# Patient Record
Sex: Male | Born: 1945 | Race: White | Hispanic: No | Marital: Married | Smoking: Former smoker
Health system: Southern US, Community
[De-identification: ages and names within clinical notes are randomized; demographics above are authoritative.]

## PROBLEM LIST (undated history)

## (undated) DIAGNOSIS — R739 Hyperglycemia, unspecified: Secondary | ICD-10-CM

## (undated) DIAGNOSIS — A809 Acute poliomyelitis, unspecified: Secondary | ICD-10-CM

## (undated) DIAGNOSIS — I2510 Atherosclerotic heart disease of native coronary artery without angina pectoris: Secondary | ICD-10-CM

## (undated) DIAGNOSIS — E785 Hyperlipidemia, unspecified: Secondary | ICD-10-CM

## (undated) DIAGNOSIS — I10 Essential (primary) hypertension: Secondary | ICD-10-CM

## (undated) DIAGNOSIS — I351 Nonrheumatic aortic (valve) insufficiency: Secondary | ICD-10-CM

## (undated) HISTORY — DX: Essential (primary) hypertension: I10

## (undated) HISTORY — PX: FISTULA PLUG: SHX5831

## (undated) HISTORY — DX: Hyperlipidemia, unspecified: E78.5

## (undated) HISTORY — DX: Acute poliomyelitis, unspecified: A80.9

## (undated) HISTORY — DX: Nonrheumatic aortic (valve) insufficiency: I35.1

## (undated) HISTORY — DX: Atherosclerotic heart disease of native coronary artery without angina pectoris: I25.10

## (undated) HISTORY — DX: Hyperglycemia, unspecified: R73.9

---

## 2014-02-12 DIAGNOSIS — Z0000 Encounter for general adult medical examination without abnormal findings: Secondary | ICD-10-CM | POA: Diagnosis not present

## 2014-02-12 DIAGNOSIS — E782 Mixed hyperlipidemia: Secondary | ICD-10-CM | POA: Diagnosis not present

## 2014-02-12 DIAGNOSIS — E161 Other hypoglycemia: Secondary | ICD-10-CM | POA: Diagnosis not present

## 2014-02-12 DIAGNOSIS — Z23 Encounter for immunization: Secondary | ICD-10-CM | POA: Diagnosis not present

## 2014-02-12 DIAGNOSIS — Z125 Encounter for screening for malignant neoplasm of prostate: Secondary | ICD-10-CM | POA: Diagnosis not present

## 2014-02-19 DIAGNOSIS — K760 Fatty (change of) liver, not elsewhere classified: Secondary | ICD-10-CM | POA: Diagnosis not present

## 2014-02-19 DIAGNOSIS — Z23 Encounter for immunization: Secondary | ICD-10-CM | POA: Diagnosis not present

## 2014-02-19 DIAGNOSIS — R0989 Other specified symptoms and signs involving the circulatory and respiratory systems: Secondary | ICD-10-CM | POA: Diagnosis not present

## 2014-02-19 DIAGNOSIS — E782 Mixed hyperlipidemia: Secondary | ICD-10-CM | POA: Diagnosis not present

## 2014-02-19 DIAGNOSIS — I779 Disorder of arteries and arterioles, unspecified: Secondary | ICD-10-CM | POA: Diagnosis not present

## 2014-06-10 DIAGNOSIS — K641 Second degree hemorrhoids: Secondary | ICD-10-CM | POA: Diagnosis not present

## 2014-06-10 DIAGNOSIS — Z1211 Encounter for screening for malignant neoplasm of colon: Secondary | ICD-10-CM | POA: Diagnosis not present

## 2014-06-10 DIAGNOSIS — D128 Benign neoplasm of rectum: Secondary | ICD-10-CM | POA: Diagnosis not present

## 2014-06-10 DIAGNOSIS — K621 Rectal polyp: Secondary | ICD-10-CM | POA: Diagnosis not present

## 2014-06-27 DIAGNOSIS — H2513 Age-related nuclear cataract, bilateral: Secondary | ICD-10-CM | POA: Diagnosis not present

## 2014-06-27 DIAGNOSIS — H11153 Pinguecula, bilateral: Secondary | ICD-10-CM | POA: Diagnosis not present

## 2014-06-27 DIAGNOSIS — H524 Presbyopia: Secondary | ICD-10-CM | POA: Diagnosis not present

## 2015-02-11 DIAGNOSIS — Z6824 Body mass index (BMI) 24.0-24.9, adult: Secondary | ICD-10-CM | POA: Diagnosis not present

## 2015-02-11 DIAGNOSIS — N529 Male erectile dysfunction, unspecified: Secondary | ICD-10-CM | POA: Diagnosis not present

## 2015-02-11 DIAGNOSIS — Z23 Encounter for immunization: Secondary | ICD-10-CM | POA: Diagnosis not present

## 2015-02-11 DIAGNOSIS — M5432 Sciatica, left side: Secondary | ICD-10-CM | POA: Diagnosis not present

## 2015-02-11 DIAGNOSIS — R143 Flatulence: Secondary | ICD-10-CM | POA: Diagnosis not present

## 2015-02-11 DIAGNOSIS — F17200 Nicotine dependence, unspecified, uncomplicated: Secondary | ICD-10-CM | POA: Diagnosis not present

## 2015-02-11 DIAGNOSIS — H6122 Impacted cerumen, left ear: Secondary | ICD-10-CM | POA: Diagnosis not present

## 2015-02-11 DIAGNOSIS — Z0000 Encounter for general adult medical examination without abnormal findings: Secondary | ICD-10-CM | POA: Diagnosis not present

## 2015-02-11 DIAGNOSIS — R413 Other amnesia: Secondary | ICD-10-CM | POA: Diagnosis not present

## 2015-02-11 DIAGNOSIS — Z125 Encounter for screening for malignant neoplasm of prostate: Secondary | ICD-10-CM | POA: Diagnosis not present

## 2015-02-11 DIAGNOSIS — E785 Hyperlipidemia, unspecified: Secondary | ICD-10-CM | POA: Diagnosis not present

## 2015-02-11 DIAGNOSIS — Z1389 Encounter for screening for other disorder: Secondary | ICD-10-CM | POA: Diagnosis not present

## 2015-04-09 ENCOUNTER — Other Ambulatory Visit: Payer: Self-pay | Admitting: Acute Care

## 2015-04-09 ENCOUNTER — Encounter: Payer: Self-pay | Admitting: Acute Care

## 2015-04-09 DIAGNOSIS — F17210 Nicotine dependence, cigarettes, uncomplicated: Principal | ICD-10-CM

## 2015-04-13 ENCOUNTER — Encounter: Payer: Self-pay | Admitting: Acute Care

## 2015-04-13 ENCOUNTER — Other Ambulatory Visit: Payer: Self-pay | Admitting: Acute Care

## 2015-04-13 DIAGNOSIS — F17210 Nicotine dependence, cigarettes, uncomplicated: Principal | ICD-10-CM

## 2015-07-03 DIAGNOSIS — H524 Presbyopia: Secondary | ICD-10-CM | POA: Diagnosis not present

## 2015-07-03 DIAGNOSIS — H2513 Age-related nuclear cataract, bilateral: Secondary | ICD-10-CM | POA: Diagnosis not present

## 2015-07-03 DIAGNOSIS — H11153 Pinguecula, bilateral: Secondary | ICD-10-CM | POA: Diagnosis not present

## 2015-08-12 DIAGNOSIS — Z6825 Body mass index (BMI) 25.0-25.9, adult: Secondary | ICD-10-CM | POA: Diagnosis not present

## 2015-08-12 DIAGNOSIS — L989 Disorder of the skin and subcutaneous tissue, unspecified: Secondary | ICD-10-CM | POA: Diagnosis not present

## 2015-08-12 DIAGNOSIS — E738 Other lactose intolerance: Secondary | ICD-10-CM | POA: Diagnosis not present

## 2015-08-12 DIAGNOSIS — N529 Male erectile dysfunction, unspecified: Secondary | ICD-10-CM | POA: Diagnosis not present

## 2015-08-12 DIAGNOSIS — F17200 Nicotine dependence, unspecified, uncomplicated: Secondary | ICD-10-CM | POA: Diagnosis not present

## 2015-08-12 DIAGNOSIS — E784 Other hyperlipidemia: Secondary | ICD-10-CM | POA: Diagnosis not present

## 2015-09-15 DIAGNOSIS — D692 Other nonthrombocytopenic purpura: Secondary | ICD-10-CM | POA: Diagnosis not present

## 2015-09-15 DIAGNOSIS — Z85828 Personal history of other malignant neoplasm of skin: Secondary | ICD-10-CM | POA: Diagnosis not present

## 2015-09-15 DIAGNOSIS — C44719 Basal cell carcinoma of skin of left lower limb, including hip: Secondary | ICD-10-CM | POA: Diagnosis not present

## 2015-09-15 DIAGNOSIS — L821 Other seborrheic keratosis: Secondary | ICD-10-CM | POA: Diagnosis not present

## 2015-10-06 ENCOUNTER — Other Ambulatory Visit: Payer: Self-pay | Admitting: Internal Medicine

## 2015-10-06 DIAGNOSIS — F17200 Nicotine dependence, unspecified, uncomplicated: Secondary | ICD-10-CM

## 2015-10-16 ENCOUNTER — Ambulatory Visit
Admission: RE | Admit: 2015-10-16 | Discharge: 2015-10-16 | Disposition: A | Discharge status: Home / Self Care | Payer: Medicare Other | Source: Ambulatory Visit | Attending: Internal Medicine | Admitting: Internal Medicine

## 2015-10-16 DIAGNOSIS — Z87891 Personal history of nicotine dependence: Secondary | ICD-10-CM | POA: Diagnosis not present

## 2015-10-16 DIAGNOSIS — F17200 Nicotine dependence, unspecified, uncomplicated: Secondary | ICD-10-CM

## 2015-10-22 ENCOUNTER — Other Ambulatory Visit: Payer: Self-pay | Admitting: Acute Care

## 2015-10-22 DIAGNOSIS — Z87891 Personal history of nicotine dependence: Secondary | ICD-10-CM

## 2016-01-09 DIAGNOSIS — Z23 Encounter for immunization: Secondary | ICD-10-CM | POA: Diagnosis not present

## 2016-02-19 DIAGNOSIS — E738 Other lactose intolerance: Secondary | ICD-10-CM | POA: Diagnosis not present

## 2016-02-19 DIAGNOSIS — E784 Other hyperlipidemia: Secondary | ICD-10-CM | POA: Diagnosis not present

## 2016-02-19 DIAGNOSIS — Z125 Encounter for screening for malignant neoplasm of prostate: Secondary | ICD-10-CM | POA: Diagnosis not present

## 2016-02-26 DIAGNOSIS — M5432 Sciatica, left side: Secondary | ICD-10-CM | POA: Diagnosis not present

## 2016-02-26 DIAGNOSIS — I700 Atherosclerosis of aorta: Secondary | ICD-10-CM | POA: Diagnosis not present

## 2016-02-26 DIAGNOSIS — K8020 Calculus of gallbladder without cholecystitis without obstruction: Secondary | ICD-10-CM | POA: Diagnosis not present

## 2016-02-26 DIAGNOSIS — R413 Other amnesia: Secondary | ICD-10-CM | POA: Diagnosis not present

## 2016-02-26 DIAGNOSIS — E739 Lactose intolerance, unspecified: Secondary | ICD-10-CM | POA: Diagnosis not present

## 2016-02-26 DIAGNOSIS — Z1389 Encounter for screening for other disorder: Secondary | ICD-10-CM | POA: Diagnosis not present

## 2016-02-26 DIAGNOSIS — R918 Other nonspecific abnormal finding of lung field: Secondary | ICD-10-CM | POA: Diagnosis not present

## 2016-02-26 DIAGNOSIS — L989 Disorder of the skin and subcutaneous tissue, unspecified: Secondary | ICD-10-CM | POA: Diagnosis not present

## 2016-02-26 DIAGNOSIS — Z0000 Encounter for general adult medical examination without abnormal findings: Secondary | ICD-10-CM | POA: Diagnosis not present

## 2016-02-26 DIAGNOSIS — H6122 Impacted cerumen, left ear: Secondary | ICD-10-CM | POA: Diagnosis not present

## 2016-02-26 DIAGNOSIS — Z6822 Body mass index (BMI) 22.0-22.9, adult: Secondary | ICD-10-CM | POA: Diagnosis not present

## 2016-02-26 DIAGNOSIS — E784 Other hyperlipidemia: Secondary | ICD-10-CM | POA: Diagnosis not present

## 2016-02-29 DIAGNOSIS — Z1212 Encounter for screening for malignant neoplasm of rectum: Secondary | ICD-10-CM | POA: Diagnosis not present

## 2016-10-17 ENCOUNTER — Ambulatory Visit
Admission: RE | Admit: 2016-10-17 | Discharge: 2016-10-17 | Disposition: A | Discharge status: Home / Self Care | Payer: Medicare Other | Source: Ambulatory Visit | Attending: Acute Care | Admitting: Acute Care

## 2016-10-17 DIAGNOSIS — Z87891 Personal history of nicotine dependence: Secondary | ICD-10-CM

## 2016-10-19 ENCOUNTER — Other Ambulatory Visit: Payer: Self-pay | Admitting: Acute Care

## 2016-10-19 DIAGNOSIS — Z87891 Personal history of nicotine dependence: Secondary | ICD-10-CM

## 2017-02-04 DIAGNOSIS — Z23 Encounter for immunization: Secondary | ICD-10-CM | POA: Diagnosis not present

## 2017-05-22 DIAGNOSIS — E7849 Other hyperlipidemia: Secondary | ICD-10-CM | POA: Diagnosis not present

## 2017-05-22 DIAGNOSIS — Z125 Encounter for screening for malignant neoplasm of prostate: Secondary | ICD-10-CM | POA: Diagnosis not present

## 2017-05-22 DIAGNOSIS — R82998 Other abnormal findings in urine: Secondary | ICD-10-CM | POA: Diagnosis not present

## 2017-05-29 DIAGNOSIS — R918 Other nonspecific abnormal finding of lung field: Secondary | ICD-10-CM | POA: Diagnosis not present

## 2017-05-29 DIAGNOSIS — K8020 Calculus of gallbladder without cholecystitis without obstruction: Secondary | ICD-10-CM | POA: Diagnosis not present

## 2017-05-29 DIAGNOSIS — Z0000 Encounter for general adult medical examination without abnormal findings: Secondary | ICD-10-CM | POA: Diagnosis not present

## 2017-05-29 DIAGNOSIS — E739 Lactose intolerance, unspecified: Secondary | ICD-10-CM | POA: Diagnosis not present

## 2017-05-29 DIAGNOSIS — M5432 Sciatica, left side: Secondary | ICD-10-CM | POA: Diagnosis not present

## 2017-05-29 DIAGNOSIS — R413 Other amnesia: Secondary | ICD-10-CM | POA: Diagnosis not present

## 2017-05-29 DIAGNOSIS — Z6824 Body mass index (BMI) 24.0-24.9, adult: Secondary | ICD-10-CM | POA: Diagnosis not present

## 2017-05-29 DIAGNOSIS — L988 Other specified disorders of the skin and subcutaneous tissue: Secondary | ICD-10-CM | POA: Diagnosis not present

## 2017-05-29 DIAGNOSIS — Z1212 Encounter for screening for malignant neoplasm of rectum: Secondary | ICD-10-CM | POA: Diagnosis not present

## 2017-05-29 DIAGNOSIS — Z1389 Encounter for screening for other disorder: Secondary | ICD-10-CM | POA: Diagnosis not present

## 2017-05-29 DIAGNOSIS — I700 Atherosclerosis of aorta: Secondary | ICD-10-CM | POA: Diagnosis not present

## 2017-05-29 DIAGNOSIS — R143 Flatulence: Secondary | ICD-10-CM | POA: Diagnosis not present

## 2017-05-29 DIAGNOSIS — R011 Cardiac murmur, unspecified: Secondary | ICD-10-CM | POA: Diagnosis not present

## 2017-06-01 DIAGNOSIS — Z13820 Encounter for screening for osteoporosis: Secondary | ICD-10-CM | POA: Diagnosis not present

## 2017-11-01 ENCOUNTER — Other Ambulatory Visit: Payer: Self-pay | Admitting: Internal Medicine

## 2017-11-01 DIAGNOSIS — R911 Solitary pulmonary nodule: Secondary | ICD-10-CM

## 2017-11-06 ENCOUNTER — Ambulatory Visit
Admission: RE | Admit: 2017-11-06 | Discharge: 2017-11-06 | Disposition: A | Discharge status: Home / Self Care | Payer: Medicare Other | Source: Ambulatory Visit | Attending: Acute Care | Admitting: Acute Care

## 2017-11-06 DIAGNOSIS — Z87891 Personal history of nicotine dependence: Secondary | ICD-10-CM | POA: Diagnosis not present

## 2017-11-14 ENCOUNTER — Other Ambulatory Visit: Payer: Self-pay | Admitting: Acute Care

## 2017-11-14 DIAGNOSIS — Z87891 Personal history of nicotine dependence: Secondary | ICD-10-CM

## 2017-11-14 DIAGNOSIS — Z122 Encounter for screening for malignant neoplasm of respiratory organs: Secondary | ICD-10-CM

## 2018-01-05 DIAGNOSIS — R011 Cardiac murmur, unspecified: Secondary | ICD-10-CM | POA: Diagnosis not present

## 2018-01-06 DIAGNOSIS — Z23 Encounter for immunization: Secondary | ICD-10-CM | POA: Diagnosis not present

## 2018-05-28 DIAGNOSIS — M859 Disorder of bone density and structure, unspecified: Secondary | ICD-10-CM | POA: Diagnosis not present

## 2018-05-28 DIAGNOSIS — E7849 Other hyperlipidemia: Secondary | ICD-10-CM | POA: Diagnosis not present

## 2018-05-28 DIAGNOSIS — R7301 Impaired fasting glucose: Secondary | ICD-10-CM | POA: Diagnosis not present

## 2018-05-28 DIAGNOSIS — R82998 Other abnormal findings in urine: Secondary | ICD-10-CM | POA: Diagnosis not present

## 2018-05-28 DIAGNOSIS — Z125 Encounter for screening for malignant neoplasm of prostate: Secondary | ICD-10-CM | POA: Diagnosis not present

## 2018-06-01 DIAGNOSIS — Z6826 Body mass index (BMI) 26.0-26.9, adult: Secondary | ICD-10-CM | POA: Diagnosis not present

## 2018-06-01 DIAGNOSIS — R011 Cardiac murmur, unspecified: Secondary | ICD-10-CM | POA: Diagnosis not present

## 2018-06-01 DIAGNOSIS — I700 Atherosclerosis of aorta: Secondary | ICD-10-CM | POA: Diagnosis not present

## 2018-06-01 DIAGNOSIS — Z1339 Encounter for screening examination for other mental health and behavioral disorders: Secondary | ICD-10-CM | POA: Diagnosis not present

## 2018-06-01 DIAGNOSIS — Z0000 Encounter for general adult medical examination without abnormal findings: Secondary | ICD-10-CM | POA: Diagnosis not present

## 2018-06-01 DIAGNOSIS — R7301 Impaired fasting glucose: Secondary | ICD-10-CM | POA: Diagnosis not present

## 2018-06-01 DIAGNOSIS — I2510 Atherosclerotic heart disease of native coronary artery without angina pectoris: Secondary | ICD-10-CM | POA: Diagnosis not present

## 2018-06-01 DIAGNOSIS — I351 Nonrheumatic aortic (valve) insufficiency: Secondary | ICD-10-CM | POA: Diagnosis not present

## 2018-06-01 DIAGNOSIS — M859 Disorder of bone density and structure, unspecified: Secondary | ICD-10-CM | POA: Diagnosis not present

## 2018-06-01 DIAGNOSIS — R413 Other amnesia: Secondary | ICD-10-CM | POA: Diagnosis not present

## 2018-06-01 DIAGNOSIS — Z1331 Encounter for screening for depression: Secondary | ICD-10-CM | POA: Diagnosis not present

## 2018-06-01 DIAGNOSIS — R918 Other nonspecific abnormal finding of lung field: Secondary | ICD-10-CM | POA: Diagnosis not present

## 2018-06-04 ENCOUNTER — Other Ambulatory Visit: Payer: Self-pay | Admitting: Internal Medicine

## 2018-06-04 DIAGNOSIS — R911 Solitary pulmonary nodule: Secondary | ICD-10-CM

## 2018-06-06 DIAGNOSIS — Z1212 Encounter for screening for malignant neoplasm of rectum: Secondary | ICD-10-CM | POA: Diagnosis not present

## 2018-06-08 ENCOUNTER — Other Ambulatory Visit: Payer: Self-pay | Admitting: Internal Medicine

## 2018-06-08 DIAGNOSIS — I351 Nonrheumatic aortic (valve) insufficiency: Secondary | ICD-10-CM

## 2018-06-29 ENCOUNTER — Encounter: Payer: Self-pay | Admitting: Cardiology

## 2018-06-29 ENCOUNTER — Ambulatory Visit (INDEPENDENT_AMBULATORY_CARE_PROVIDER_SITE_OTHER): Payer: Medicare Other | Admitting: Cardiology

## 2018-06-29 VITALS — BP 157/78 | HR 72 | Ht 67.0 in | Wt 173.9 lb

## 2018-06-29 DIAGNOSIS — K219 Gastro-esophageal reflux disease without esophagitis: Secondary | ICD-10-CM | POA: Diagnosis not present

## 2018-06-29 DIAGNOSIS — I10 Essential (primary) hypertension: Secondary | ICD-10-CM

## 2018-06-29 DIAGNOSIS — E785 Hyperlipidemia, unspecified: Secondary | ICD-10-CM | POA: Insufficient documentation

## 2018-06-29 DIAGNOSIS — I351 Nonrheumatic aortic (valve) insufficiency: Secondary | ICD-10-CM | POA: Diagnosis not present

## 2018-06-29 DIAGNOSIS — Z0189 Encounter for other specified special examinations: Secondary | ICD-10-CM | POA: Diagnosis not present

## 2018-06-29 DIAGNOSIS — I2510 Atherosclerotic heart disease of native coronary artery without angina pectoris: Secondary | ICD-10-CM

## 2018-06-29 DIAGNOSIS — R739 Hyperglycemia, unspecified: Secondary | ICD-10-CM

## 2018-06-29 DIAGNOSIS — Z136 Encounter for screening for cardiovascular disorders: Secondary | ICD-10-CM | POA: Diagnosis not present

## 2018-06-29 DIAGNOSIS — R002 Palpitations: Secondary | ICD-10-CM

## 2018-06-29 HISTORY — DX: Hyperlipidemia, unspecified: E78.5

## 2018-06-29 HISTORY — DX: Atherosclerotic heart disease of native coronary artery without angina pectoris: I25.10

## 2018-06-29 HISTORY — DX: Nonrheumatic aortic (valve) insufficiency: I35.1

## 2018-06-29 HISTORY — DX: Hyperglycemia, unspecified: R73.9

## 2018-06-29 MED ORDER — LOSARTAN POTASSIUM 50 MG PO TABS: 50.0000 mg | ORAL_TABLET | Freq: Every day | ORAL | 1 refills | Status: DC

## 2018-06-29 NOTE — Progress Notes (Signed)
Subjective:  Primary Physician:  Crist Infante, MD  Patient ID: Gary Martin, male    DOB: 05/11/1945, 73 y.o.   MRN: [id number]  Chief Complaint  Patient presents with  . New Patient (Initial Visit)    Aortic Regurgitation    HPI: Gary Martin  is a 73 y.o. male  with Patient with no significant prior cardiovascular history except for coronary atherosclerosis and aortic atherosclerosis noted by CT scan of the chest performed in 2018, mild hyperlipidemia, hyperglycemia, moderate aortic regurgitation noted in 2019 referred to me to establish cardiac care as over the past one year he has noticed  Palpitations like " flip-flop" and heart feels like it stops and also has noticed tingling in both arms. Episodes are sporadic. He wanted to have this checked out.  Only other symptom is  Daily sour eructation.    Past Medical History:  Diagnosis Date  . Coronary artery calcification seen on CAT scan 06/29/2018   06/18 CT: Coronary and aortic atheresclerosis  . Hyperglycemia 06/29/2018  . Mild hyperlipidemia 06/29/2018  . Moderate aortic regurgitation 06/29/2018  . Polio     Past Surgical History:  Procedure Laterality Date  . FISTULA PLUG     from tail bone    Social History   Socioeconomic History  . Marital status: Married    Spouse name: Not on file  . Number of children: 2  . Years of education: Not on file  . Highest education level: Not on file  Occupational History  . Not on file  Social Needs  . Financial resource strain: Not on file  . Food insecurity:    Worry: Not on file    Inability: Not on file  . Transportation needs:    Medical: Not on file    Non-medical: Not on file  Tobacco Use  . Smoking status: Former Smoker    Packs/day: 2.00    Years: 30.00    Pack years: 60.00    Types: Cigarettes    Start date: 03/26/1963    Last attempt to quit: 03/25/2014    Years since quitting: 4.2  . Smokeless tobacco: Never Used  Substance and Sexual Activity  . Alcohol use:  Yes    Alcohol/week: 1.0 standard drinks    Types: 1 Cans of beer per week    Comment: occ  . Drug use: Never  . Sexual activity: Not on file  Lifestyle  . Physical activity:    Days per week: Not on file    Minutes per session: Not on file  . Stress: Not on file  Relationships  . Social connections:    Talks on phone: Not on file    Gets together: Not on file    Attends religious service: Not on file    Active member of club or organization: Not on file    Attends meetings of clubs or organizations: Not on file    Relationship status: Not on file  . Intimate partner violence:    Fear of current or ex partner: Not on file    Emotionally abused: Not on file    Physically abused: Not on file    Forced sexual activity: Not on file  Other Topics Concern  . Not on file  Social History Narrative  . Not on file    Current Outpatient Medications on File Prior to Visit  Medication Sig Dispense Refill  . aspirin EC 81 MG tablet Take 81 mg by mouth daily.    Marland Kitchen  pravastatin (PRAVACHOL) 40 MG tablet Take 1 tablet by mouth daily.     No current facility-administered medications on file prior to visit.    Review of Systems  Constitutional: Negative for malaise/fatigue and weight loss.  Respiratory: Negative for cough, hemoptysis and shortness of breath.   Cardiovascular: Positive for palpitations. Negative for chest pain, claudication and leg swelling.  Gastrointestinal: Positive for heartburn (sour erutation). Negative for abdominal pain, blood in stool, constipation and vomiting.  Genitourinary: Negative for dysuria.  Musculoskeletal: Negative for joint pain and myalgias.  Neurological: Negative for dizziness, focal weakness and headaches.  Endo/Heme/Allergies: Does not bruise/bleed easily.  Psychiatric/Behavioral: Negative for depression. The patient is not nervous/anxious.   All other systems reviewed and are negative.     Objective:  Blood pressure (!) 157/78, pulse 72, height  '5\' 7"'$  (1.702 m), weight 173 lb 14.4 oz (78.9 kg), SpO2 97 %. Body mass index is 27.24 kg/m.  Physical Exam  Constitutional: He appears well-developed and well-nourished. No distress.  HENT:  Head: Atraumatic.  Eyes: Conjunctivae are normal.  Neck: Neck supple. No JVD present. No thyromegaly present.  Cardiovascular: Normal rate, regular rhythm and intact distal pulses. Exam reveals no gallop.  Murmur heard.  Blowing early systolic murmur is present with a grade of 2/6 at the upper right sternal border radiating to the neck.  Diastolic murmur is present with a grade of 2/6 at the upper right sternal border and lower left sternal border. Pulmonary/Chest: Effort normal and breath sounds normal.  Abdominal: Soft. Bowel sounds are normal.  Musculoskeletal: Normal range of motion.        General: No edema.  Neurological: He is alert.  Skin: Skin is warm and dry.  Psychiatric: He has a normal mood and affect.    CARDIAC STUDIES:   Echocardiogram 01/05/2018: Left ventricle cavity is normal in size. Normal global wall motion. Normal diastolic filling pattern. Calculated EF 65%. Moderate (Grade II) aortic regurgitation. Mild tricuspid regurgitation. No evidence of pulmonary hypertension  Assessment & Recommendations:   1. Moderate aortic regurgitation  2. Coronary artery calcification seen on CAT scan  3. Mild hyperlipidemia  4. Hyperglycemia  5. Palpitations  EKG 06/29/2018: Normal sinus rhythm at rate of 70 bpm, normal axis.  Incomplete right bundle branch block.  No evidence of ischemia, otherwise normal EKG.  6.  Sour eructation and GERD.  7. H/O tobacco use quit 2016  50-60 pack year history of smoking cigarettes  8. Screening for AAA  9. Elevated BP  Laboratory examination: Labs 05/28/2018: Urine analysis normal.  Serum glucose 102 mg, BUN 18, creatinine 1.0, eGFR greater than 61, potassium 5.4.  HB 15.0/HCT 45.7, platelets 172.  Total cholesterol 134, triglycerides  124, HDL 41, LDL 68, non-HDL cholesterol 93.  TSH normal.  Apo lipoprotein be 68.0, normal.  White him and he 41.8.  Recommendation:  Patient referred to me for evaluation of palpitations and aortic regurgitation noted on echocardiogram.  He has significant history of tobacco use, 50-60-pack-year history, he also has coronary atherosclerosis and aortic atherosclerosis.  His lipids are very well controlled, he has very mild hyperglycemia.  He appears to be asymptomatic, walking for about half a mile on the treadmill.  However due to palpitations, also abnormal CT scan, would like him to do a routine treadmill exercise stress test to exclude ischemia-induced arrhythmias.  His blood pressure was elevated today but he has now been told to have hypertension, blood pressures all been less than 130 mmHg at home.  Hence I did not make any changes to his medication.  I would like him to be on an ACE inhibitor in view of aortic and coronary atherosclerosis and also aortic regurgitation.  Patient was initially reluctant, but he is willing to take the medication.  I'll start him on losartan 50 mg daily, advised him to have a BMP in 2 weeks.  I'll see him back after the treadmill stress test and make final recommendations.  With regard to aortic regurgitation, continued observation is indicated. I will also set him up for abdominal aortic aneurysm screening. He can try PPI and advised patient to take 30 minutes prior to a meal once a day, but can start twice daily for a week.   Adrian Prows, MD, [facility] 06/29/2018, 1:32 PM [facility] [place] Pager: [phone] Office: [phone] If no answer Cell [phone]

## 2018-07-02 DIAGNOSIS — I10 Essential (primary) hypertension: Secondary | ICD-10-CM | POA: Diagnosis not present

## 2018-07-08 DIAGNOSIS — I10 Essential (primary) hypertension: Secondary | ICD-10-CM | POA: Diagnosis not present

## 2018-07-16 ENCOUNTER — Other Ambulatory Visit: Payer: Medicare Other

## 2018-09-03 ENCOUNTER — Ambulatory Visit: Payer: Medicare Other | Admitting: Cardiology

## 2018-12-21 DIAGNOSIS — Z23 Encounter for immunization: Secondary | ICD-10-CM | POA: Diagnosis not present

## 2019-01-01 ENCOUNTER — Other Ambulatory Visit: Payer: Self-pay

## 2019-02-05 ENCOUNTER — Ambulatory Visit: Payer: Medicare Other | Admitting: Physician Assistant

## 2019-03-15 DIAGNOSIS — C44619 Basal cell carcinoma of skin of left upper limb, including shoulder: Secondary | ICD-10-CM | POA: Diagnosis not present

## 2019-03-15 DIAGNOSIS — C4441 Basal cell carcinoma of skin of scalp and neck: Secondary | ICD-10-CM | POA: Diagnosis not present

## 2019-03-15 DIAGNOSIS — L570 Actinic keratosis: Secondary | ICD-10-CM | POA: Diagnosis not present

## 2019-03-15 DIAGNOSIS — Z85828 Personal history of other malignant neoplasm of skin: Secondary | ICD-10-CM | POA: Diagnosis not present

## 2019-03-18 ENCOUNTER — Ambulatory Visit: Payer: Medicare Other | Admitting: Family Medicine

## 2019-06-03 DIAGNOSIS — J869 Pyothorax without fistula: Secondary | ICD-10-CM | POA: Diagnosis not present

## 2019-06-03 DIAGNOSIS — R040 Epistaxis: Secondary | ICD-10-CM | POA: Diagnosis not present

## 2019-06-03 DIAGNOSIS — E785 Hyperlipidemia, unspecified: Secondary | ICD-10-CM | POA: Diagnosis not present

## 2019-06-03 DIAGNOSIS — I10 Essential (primary) hypertension: Secondary | ICD-10-CM | POA: Diagnosis not present

## 2019-06-03 DIAGNOSIS — K219 Gastro-esophageal reflux disease without esophagitis: Secondary | ICD-10-CM | POA: Diagnosis not present

## 2019-06-23 ENCOUNTER — Ambulatory Visit: Payer: Medicare Other | Attending: Internal Medicine

## 2019-06-23 DIAGNOSIS — Z23 Encounter for immunization: Secondary | ICD-10-CM | POA: Insufficient documentation

## 2019-06-23 NOTE — Progress Notes (Signed)
   Covid-19 Vaccination Clinic  Name:  Xzaiden Claborn    MRN: [id number] DOB: [birth year]  06/23/2019  Mr. Linna Darner was observed post Covid-19 immunization for 30 minutes based on pre-vaccination screening without incidence. He was provided with Vaccine Information Sheet and instruction to access the V-Safe system.   Mr. Linna Darner was instructed to call 911 with any severe reactions post vaccine: Marland Kitchen Difficulty breathing  . Swelling of your face and throat  . A fast heartbeat  . A bad rash all over your body  . Dizziness and weakness    Immunizations Administered    Name Date Dose VIS Date Route   Pfizer COVID-19 Vaccine 06/23/2019  2:37 PM 0.3 mL 04/05/2019 Intramuscular   Manufacturer: [employer]   Lot: [id number]   [place]: [id number]

## 2019-07-16 ENCOUNTER — Ambulatory Visit: Payer: Medicare Other | Admitting: Family Medicine

## 2019-07-23 ENCOUNTER — Ambulatory Visit: Payer: Medicare Other | Attending: Internal Medicine

## 2019-07-23 DIAGNOSIS — Z23 Encounter for immunization: Secondary | ICD-10-CM

## 2019-07-23 NOTE — Progress Notes (Signed)
   Covid-19 Vaccination Clinic  Name:  Gary Martin    MRN: [id number] DOB: Mar 24, 1946  07/23/2019  [name] was observed post Covid-19 immunization for 30 minutes based on pre-vaccination screening without incident. He was provided with Vaccine Information Sheet and instruction to access the V-Safe system.   [name] was instructed to call 911 with any severe reactions post vaccine: Marland Kitchen Difficulty breathing  . Swelling of face and throat  . A fast heartbeat  . A bad rash all over body  . Dizziness and weakness   Immunizations Administered    Name Date Dose VIS Date Route   Pfizer COVID-19 Vaccine 07/23/2019  2:40 PM 0.3 mL 04/05/2019 Intramuscular   Manufacturer: [employer]   Lot: [id number]   [place]: [id number]

## 2019-08-06 DIAGNOSIS — I10 Essential (primary) hypertension: Secondary | ICD-10-CM | POA: Diagnosis not present

## 2019-08-06 DIAGNOSIS — Z87891 Personal history of nicotine dependence: Secondary | ICD-10-CM | POA: Diagnosis not present

## 2019-08-06 DIAGNOSIS — R7309 Other abnormal glucose: Secondary | ICD-10-CM | POA: Diagnosis not present

## 2019-08-06 DIAGNOSIS — E785 Hyperlipidemia, unspecified: Secondary | ICD-10-CM | POA: Diagnosis not present

## 2019-08-06 DIAGNOSIS — Z0000 Encounter for general adult medical examination without abnormal findings: Secondary | ICD-10-CM | POA: Diagnosis not present

## 2019-08-06 DIAGNOSIS — J869 Pyothorax without fistula: Secondary | ICD-10-CM | POA: Diagnosis not present

## 2019-08-06 DIAGNOSIS — Z1389 Encounter for screening for other disorder: Secondary | ICD-10-CM | POA: Diagnosis not present

## 2019-08-06 DIAGNOSIS — K219 Gastro-esophageal reflux disease without esophagitis: Secondary | ICD-10-CM | POA: Diagnosis not present

## 2019-08-06 DIAGNOSIS — Z125 Encounter for screening for malignant neoplasm of prostate: Secondary | ICD-10-CM | POA: Diagnosis not present

## 2019-08-07 ENCOUNTER — Other Ambulatory Visit: Payer: Self-pay | Admitting: Internal Medicine

## 2019-08-07 DIAGNOSIS — Z87891 Personal history of nicotine dependence: Secondary | ICD-10-CM

## 2019-10-10 ENCOUNTER — Other Ambulatory Visit: Payer: Self-pay

## 2019-10-10 ENCOUNTER — Ambulatory Visit
Admission: RE | Admit: 2019-10-10 | Discharge: 2019-10-10 | Disposition: A | Discharge status: Home / Self Care | Payer: Medicare Other | Source: Ambulatory Visit | Attending: Internal Medicine | Admitting: Internal Medicine

## 2019-10-10 DIAGNOSIS — Z87891 Personal history of nicotine dependence: Secondary | ICD-10-CM

## 2020-01-20 DIAGNOSIS — Z85828 Personal history of other malignant neoplasm of skin: Secondary | ICD-10-CM | POA: Diagnosis not present

## 2020-01-20 DIAGNOSIS — C44212 Basal cell carcinoma of skin of right ear and external auricular canal: Secondary | ICD-10-CM | POA: Diagnosis not present

## 2020-01-20 DIAGNOSIS — D225 Melanocytic nevi of trunk: Secondary | ICD-10-CM | POA: Diagnosis not present

## 2020-01-20 DIAGNOSIS — L821 Other seborrheic keratosis: Secondary | ICD-10-CM | POA: Diagnosis not present

## 2020-01-20 DIAGNOSIS — L570 Actinic keratosis: Secondary | ICD-10-CM | POA: Diagnosis not present

## 2020-01-20 DIAGNOSIS — D1801 Hemangioma of skin and subcutaneous tissue: Secondary | ICD-10-CM | POA: Diagnosis not present

## 2020-01-20 DIAGNOSIS — L814 Other melanin hyperpigmentation: Secondary | ICD-10-CM | POA: Diagnosis not present

## 2020-01-31 DIAGNOSIS — Z23 Encounter for immunization: Secondary | ICD-10-CM | POA: Diagnosis not present

## 2020-02-04 DIAGNOSIS — Z85828 Personal history of other malignant neoplasm of skin: Secondary | ICD-10-CM | POA: Diagnosis not present

## 2020-02-04 DIAGNOSIS — C44212 Basal cell carcinoma of skin of right ear and external auricular canal: Secondary | ICD-10-CM | POA: Diagnosis not present

## 2020-03-03 ENCOUNTER — Encounter: Payer: Self-pay | Admitting: Family Medicine

## 2020-03-03 ENCOUNTER — Other Ambulatory Visit: Payer: Self-pay

## 2020-03-03 ENCOUNTER — Ambulatory Visit (INDEPENDENT_AMBULATORY_CARE_PROVIDER_SITE_OTHER): Payer: Medicare Other | Admitting: Family Medicine

## 2020-03-03 DIAGNOSIS — K219 Gastro-esophageal reflux disease without esophagitis: Secondary | ICD-10-CM

## 2020-03-03 DIAGNOSIS — C4490 Unspecified malignant neoplasm of skin, unspecified: Secondary | ICD-10-CM | POA: Diagnosis not present

## 2020-03-03 DIAGNOSIS — Z87891 Personal history of nicotine dependence: Secondary | ICD-10-CM | POA: Diagnosis not present

## 2020-03-03 DIAGNOSIS — H269 Unspecified cataract: Secondary | ICD-10-CM | POA: Diagnosis not present

## 2020-03-03 DIAGNOSIS — I10 Essential (primary) hypertension: Secondary | ICD-10-CM | POA: Diagnosis not present

## 2020-03-03 DIAGNOSIS — I700 Atherosclerosis of aorta: Secondary | ICD-10-CM | POA: Diagnosis not present

## 2020-03-03 DIAGNOSIS — E785 Hyperlipidemia, unspecified: Secondary | ICD-10-CM

## 2020-03-03 NOTE — Assessment & Plan Note (Signed)
Gets yearly CT scanning.  Due for next 09/2020

## 2020-03-03 NOTE — Assessment & Plan Note (Signed)
Follow-up with dermatology.  No concerning lesions today.

## 2020-03-03 NOTE — Assessment & Plan Note (Signed)
At goal per JNC 8.  Continue valsartan 80 mg daily.

## 2020-03-03 NOTE — Assessment & Plan Note (Signed)
Requesting referral to ophthalmology.  He will look for a few local ophthalmologist and let us know if he needs referral.

## 2020-03-03 NOTE — Assessment & Plan Note (Signed)
Continue pravastatin 40 mg daily.  Recheck lipids at next CPE.

## 2020-03-03 NOTE — Progress Notes (Signed)
Gary Martin is a 74 y.o. male who presents today for an office visit.  He is a new patient.   Assessment/Plan:  Chronic Problems Addressed Today: Skin cancer Follow-up with dermatology.  No concerning lesions today.  Aortic atherosclerosis (HCC) On statin.  Former smoker Gets yearly CT scanning.  Due for next 09/2020  Cataract Requesting referral to ophthalmology.  He will look for a few local ophthalmologist and let us know if he needs referral.  GERD (gastroesophageal reflux disease) Continue Pepcid 10 mg twice daily.  Dyslipidemia Continue pravastatin 40 mg daily.  Recheck lipids at next CPE.  Essential hypertension At goal per JNC 8.  Continue valsartan 80 mg daily.  Preventative health care Patient received 1 dose of pneumonia vaccine.  Not sure which.  Will check with previous PCP to see if we can get records.  Had colonoscopy in 2015.  Due for next in 2025.  Received COVID and flu vaccine.    Subjective:  HPI:  Patient here to establish care.  See A/P for status of chronic conditions.  Overall doing well.  Currently taking pravastatin and valsartan.  Doing well.  Saw cardiology about a year ago.  Was recommended to have stress test but no follow-up for this.  Denies any chest pain or shortness of breath.  PMH:  The following were reviewed and entered/updated in epic: Past Medical History:  Diagnosis Date  . Coronary artery calcification seen on CAT scan 06/29/2018   06/18 CT: Coronary and aortic atheresclerosis  . Hyperglycemia 06/29/2018  . Hypertension   . Mild hyperlipidemia 06/29/2018  . Moderate aortic regurgitation 06/29/2018  . Polio    Patient Active Problem List   Diagnosis Date Noted  . Essential hypertension 03/03/2020  . Dyslipidemia 03/03/2020  . GERD (gastroesophageal reflux disease) 03/03/2020  . Cataract 03/03/2020  . Former smoker 03/03/2020  . Aortic atherosclerosis ([place]) 03/03/2020  . Skin cancer 03/03/2020  . Moderate aortic regurgitation  06/29/2018  . Hyperglycemia 06/29/2018   Past Surgical History:  Procedure Laterality Date  . FISTULA PLUG     from tail bone    Family History  Problem Relation Age of Onset  . Heart attack Half-Brother     Medications- reviewed and updated Current Outpatient Medications  Medication Sig Dispense Refill  . calcium-vitamin D (OSCAL WITH D) 500-200 MG-UNIT tablet Take 1 tablet by mouth.    . famotidine (PEPCID) 10 MG tablet Take 10 mg by mouth 2 (two) times daily.    . magnesium 30 MG tablet Take 30 mg by mouth 2 (two) times daily.    . Multiple Vitamin (MULTIVITAMIN) tablet Take 1 tablet by mouth daily.    . pravastatin (PRAVACHOL) 40 MG tablet Take 1 tablet by mouth daily.    . valsartan (DIOVAN) 80 MG tablet Take 80 mg by mouth daily.     No current facility-administered medications for this visit.    Allergies-reviewed and updated Allergies  Allergen Reactions  . Wellbutrin [Bupropion] Swelling and Rash    Pt was admitted to ED    Social History   Socioeconomic History  . Marital status: Married    Spouse name: Not on file  . Number of children: 2  . Years of education: Not on file  . Highest education level: Not on file  Occupational History  . Not on file  Tobacco Use  . Smoking status: Former Smoker    Packs/day: 2.00    Years: 30.00    Pack years: 60.00  Types: Cigarettes    Start date: 03/26/1963    Quit date: 03/25/2014    Years since quitting: 5.9  . Smokeless tobacco: Never Used  Substance and Sexual Activity  . Alcohol use: Yes    Alcohol/week: 1.0 standard drink    Types: 1 Cans of beer per week    Comment: occ  . Drug use: Never  . Sexual activity: Not on file  Other Topics Concern  . Not on file  Social History Narrative  . Not on file   Social Determinants of Health   Financial Resource Strain:   . Difficulty of Paying Living Expenses: Not on file  Food Insecurity:   . Worried About Charity fundraiser in the Last Year: Not on file   . Ran Out of Food in the Last Year: Not on file  Transportation Needs:   . Lack of Transportation (Medical): Not on file  . Lack of Transportation (Non-Medical): Not on file  Physical Activity:   . Days of Exercise per Week: Not on file  . Minutes of Exercise per Session: Not on file  Stress:   . Feeling of Stress : Not on file  Social Connections:   . Frequency of Communication with Friends and Family: Not on file  . Frequency of Social Gatherings with Friends and Family: Not on file  . Attends Religious Services: Not on file  . Active Member of Clubs or Organizations: Not on file  . Attends Archivist Meetings: Not on file  . Marital Status: Not on file          Objective:  Physical Exam: BP (!) 146/71   Pulse 82   Temp 98.2 F (36.8 C) (Temporal)   Ht 5\' 7"  (1.702 m)   Wt 168 lb 3.2 oz (76.3 kg)   SpO2 98%   BMI 26.34 kg/m   Gen: No acute distress, resting comfortably CV: Regular rate and rhythm with no murmurs appreciated Pulm: Normal work of breathing, clear to auscultation bilaterally with no crackles, wheezes, or rhonchi Neuro: Grossly normal, moves all extremities Psych: Normal affect and thought content   Time Spent: 67 minutes of total time was spent on the date of the encounter performing the following actions: chart review prior to seeing the patient, obtaining history, performing a medically necessary exam, counseling on the treatment plan including continuing pravastatin and valsartan, placing orders, and documenting in our EHR.       [name]. Jerline Pain, MD 03/03/2020 2:28 PM

## 2020-03-03 NOTE — Assessment & Plan Note (Signed)
Continue Pepcid 10 mg twice daily.

## 2020-03-03 NOTE — Assessment & Plan Note (Signed)
On statin.

## 2020-03-03 NOTE — Patient Instructions (Signed)
It was very nice to see you today!  We can send you to dr Bing Plume or Dr Katy Fitch for your eyes.  Please let me know if you would like to see someone else.  I will see you back in year for your next annual checkup with labs.  Please come back to see me sooner if needed.  Take care, Dr Jerline Pain  Please try these tips to maintain a healthy lifestyle:   Eat at least 3 REAL meals and 1-2 snacks per day.  Aim for no more than 5 hours between eating.  If you eat breakfast, please do so within one hour of getting up.    Each meal should contain half fruits/vegetables, one quarter protein, and one quarter carbs (no bigger than a computer mouse)   Cut down on sweet beverages. This includes juice, soda, and sweet tea.     Drink at least 1 glass of water with each meal and aim for at least 8 glasses per day   Exercise at least 150 minutes every week.

## 2020-03-18 ENCOUNTER — Other Ambulatory Visit: Payer: Self-pay | Admitting: *Deleted

## 2020-03-18 MED ORDER — PRAVASTATIN SODIUM 40 MG PO TABS: 40.0000 mg | ORAL_TABLET | Freq: Every day | ORAL | 0 refills | Status: DC

## 2020-03-18 MED ORDER — VALSARTAN 80 MG PO TABS: 80.0000 mg | ORAL_TABLET | Freq: Every day | ORAL | 0 refills | Status: DC

## 2020-05-29 ENCOUNTER — Other Ambulatory Visit: Payer: Self-pay | Admitting: Family Medicine

## 2020-06-11 DIAGNOSIS — Z85828 Personal history of other malignant neoplasm of skin: Secondary | ICD-10-CM | POA: Diagnosis not present

## 2020-06-11 DIAGNOSIS — D692 Other nonthrombocytopenic purpura: Secondary | ICD-10-CM | POA: Diagnosis not present

## 2020-08-06 ENCOUNTER — Other Ambulatory Visit: Payer: Self-pay | Admitting: Family Medicine

## 2020-08-10 ENCOUNTER — Telehealth: Payer: Self-pay | Admitting: Family Medicine

## 2020-08-10 DIAGNOSIS — Z23 Encounter for immunization: Secondary | ICD-10-CM | POA: Diagnosis not present

## 2020-08-10 NOTE — Telephone Encounter (Signed)
Left message for patient to call back and schedule Medicare Annual Wellness Visit (AWV) either virtually OR in office.   PER [place], PT IS DUE FOR AWV-S AS OF 07/24/20; please schedule at anytime with [facility]Nurse Health Advisor at [facility].  This should be a 45 minute visit.

## 2020-08-17 ENCOUNTER — Ambulatory Visit (INDEPENDENT_AMBULATORY_CARE_PROVIDER_SITE_OTHER): Payer: Medicare Other

## 2020-08-17 DIAGNOSIS — Z0000 Encounter for general adult medical examination without abnormal findings: Secondary | ICD-10-CM | POA: Diagnosis not present

## 2020-08-17 NOTE — Patient Instructions (Addendum)
[name] , Thank you for taking time to come for your Medicare Wellness Visit. I appreciate your ongoing commitment to your health goals. Please review the following plan we discussed and let me know if I can assist you in the future.   Screening recommendations/referrals: Colonoscopy: Done 02/11/15  Recommended yearly ophthalmology/optometry visit for glaucoma screening and checkup Recommended yearly dental visit for hygiene and checkup  Vaccinations: Influenza vaccine: Up to date Pneumococcal vaccine: Up to date Tdap vaccine: Up to date Shingles vaccine: Shingrix 1st dose 08/10/20 Covid-19: Completed 2/28, 3/30, 01/31/20 &08/10/20                               Advanced directives: Advance directive discussed with you today. I have provided a copy for you to complete at home and have notarized. Once this is complete please bring a copy in to our office so we can scan it into your chart.  Conditions/risks identified: Keep living   Next appointment: Follow up in one year for your annual wellness visit.   Preventive Care 74 Years and Older, Male Preventive care refers to lifestyle choices and visits with your health care provider that can promote health and wellness. What does preventive care include?  A yearly physical exam. This is also called an annual well check.  Dental exams once or twice a year.  Routine eye exams. Ask your health care provider how often you should have your eyes checked.  Personal lifestyle choices, including:  Daily care of your teeth and gums.  Regular physical activity.  Eating a healthy diet.  Avoiding tobacco and drug use.  Limiting alcohol use.  Practicing safe sex.  Taking low doses of aspirin every day.  Taking vitamin and mineral supplements as recommended by your health care provider. What happens during an annual well check? The services and screenings done by your health care provider during your annual  well check will depend on your age, overall health, lifestyle risk factors, and family history of disease. Counseling  Your health care provider may ask you questions about your:  Alcohol use.  Tobacco use.  Drug use.  Emotional well-being.  Home and relationship well-being.  Sexual activity.  Eating habits.  History of falls.  Memory and ability to understand (cognition).  Work and work Statistician. Screening  You may have the following tests or measurements:  Height, weight, and BMI.  Blood pressure.  Lipid and cholesterol levels. These may be checked every 5 years, or more frequently if you are over 72 years old.  Skin check.  Lung cancer screening. You may have this screening every year starting at age 43 if you have a 30-pack-year history of smoking and currently smoke or have quit within the past 15 years.  Fecal occult blood test (FOBT) of the stool. You may have this test every year starting at age 60.  Flexible sigmoidoscopy or colonoscopy. You may have a sigmoidoscopy every 5 years or a colonoscopy every 10 years starting at age 27.  Prostate cancer screening. Recommendations will vary depending on your family history and other risks.  Hepatitis C blood test.  Hepatitis B blood test.  Sexually transmitted disease (STD) testing.  Diabetes screening. This is done by checking your blood sugar (glucose) after you have not eaten for a while (fasting). You may have this done every 1-3 years.  Abdominal aortic aneurysm (AAA) screening. You may need this if you are a  current or former smoker.  Osteoporosis. You may be screened starting at age 56 if you are at high risk. Talk with your health care provider about your test results, treatment options, and if necessary, the need for more tests. Vaccines  Your health care provider may recommend certain vaccines, such as:  Influenza vaccine. This is recommended every year.  Tetanus, diphtheria, and acellular  pertussis (Tdap, Td) vaccine. You may need a Td booster every 10 years.  Zoster vaccine. You may need this after age 31.  Pneumococcal 13-valent conjugate (PCV13) vaccine. One dose is recommended after age 57.  Pneumococcal polysaccharide (PPSV23) vaccine. One dose is recommended after age 7. Talk to your health care provider about which screenings and vaccines you need and how often you need them. This information is not intended to replace advice given to you by your health care provider. Make sure you discuss any questions you have with your health care provider. Document Released: 05/08/2015 Document Revised: 12/30/2015 Document Reviewed: 02/10/2015 Elsevier Interactive Patient Education  2017 [place] Prevention in the Home Falls can cause injuries. They can happen to people of all ages. There are many things you can do to make your home safe and to help prevent falls. What can I do on the outside of my home?  Regularly fix the edges of walkways and driveways and fix any cracks.  Remove anything that might make you trip as you walk through a door, such as a raised step or threshold.  Trim any bushes or trees on the path to your home.  Use bright outdoor lighting.  Clear any walking paths of anything that might make someone trip, such as rocks or tools.  Regularly check to see if handrails are loose or broken. Make sure that both sides of any steps have handrails.  Any raised decks and porches should have guardrails on the edges.  Have any leaves, snow, or ice cleared regularly.  Use sand or salt on walking paths during winter.  Clean up any spills in your garage right away. This includes oil or grease spills. What can I do in the bathroom?  Use night lights.  Install grab bars by the toilet and in the tub and shower. Do not use towel bars as grab bars.  Use non-skid mats or decals in the tub or shower.  If you need to sit down in the shower, use a plastic,  non-slip stool.  Keep the floor dry. Clean up any water that spills on the floor as soon as it happens.  Remove soap buildup in the tub or shower regularly.  Attach bath mats securely with double-sided non-slip rug tape.  Do not have throw rugs and other things on the floor that can make you trip. What can I do in the bedroom?  Use night lights.  Make sure that you have a light by your bed that is easy to reach.  Do not use any sheets or blankets that are too big for your bed. They should not hang down onto the floor.  Have a firm chair that has side arms. You can use this for support while you get dressed.  Do not have throw rugs and other things on the floor that can make you trip. What can I do in the kitchen?  Clean up any spills right away.  Avoid walking on wet floors.  Keep items that you use a lot in easy-to-reach places.  If you need to reach something above  you, use a strong step stool that has a grab bar.  Keep electrical cords out of the way.  Do not use floor polish or wax that makes floors slippery. If you must use wax, use non-skid floor wax.  Do not have throw rugs and other things on the floor that can make you trip. What can I do with my stairs?  Do not leave any items on the stairs.  Make sure that there are handrails on both sides of the stairs and use them. Fix handrails that are broken or loose. Make sure that handrails are as long as the stairways.  Check any carpeting to make sure that it is firmly attached to the stairs. Fix any carpet that is loose or worn.  Avoid having throw rugs at the top or bottom of the stairs. If you do have throw rugs, attach them to the floor with carpet tape.  Make sure that you have a light switch at the top of the stairs and the bottom of the stairs. If you do not have them, ask someone to add them for you. What else can I do to help prevent falls?  Wear shoes that:  Do not have high heels.  Have rubber  bottoms.  Are comfortable and fit you well.  Are closed at the toe. Do not wear sandals.  If you use a stepladder:  Make sure that it is fully opened. Do not climb a closed stepladder.  Make sure that both sides of the stepladder are locked into place.  Ask someone to hold it for you, if possible.  Clearly mark and make sure that you can see:  Any grab bars or handrails.  First and last steps.  Where the edge of each step is.  Use tools that help you move around (mobility aids) if they are needed. These include:  Canes.  Walkers.  Scooters.  Crutches.  Turn on the lights when you go into a dark area. Replace any light bulbs as soon as they burn out.  Set up your furniture so you have a clear path. Avoid moving your furniture around.  If any of your floors are uneven, fix them.  If there are any pets around you, be aware of where they are.  Review your medicines with your doctor. Some medicines can make you feel dizzy. This can increase your chance of falling. Ask your doctor what other things that you can do to help prevent falls. This information is not intended to replace advice given to you by your health care provider. Make sure you discuss any questions you have with your health care provider. Document Released: 02/05/2009 Document Revised: 09/17/2015 Document Reviewed: 05/16/2014 Elsevier Interactive Patient Education  2017 [employer].

## 2020-08-17 NOTE — Progress Notes (Signed)
Virtual Visit via Telephone Note  I connected with  Gary Martin on 08/17/20 at  3:15 PM EDT by telephone and verified that I am speaking with the correct person using two identifiers.  Medicare Annual Wellness visit completed telephonically due to Covid-19 pandemic.   Persons participating in this call: This Health Coach and this patient.   Location: Patient: Home  Provider: Office    I discussed the limitations, risks, security and privacy concerns of performing an evaluation and management service by telephone and the availability of in person appointments. The patient expressed understanding and agreed to proceed.  Unable to perform video visit due to video visit attempted and failed and/or patient does not have video capability.   Some vital signs may be absent or patient reported.   Willette Brace, LPN    Subjective:   Gary Martin is a 75 y.o. male who presents for an Initial Medicare Annual Wellness Visit.  Review of Systems     Cardiac Risk Factors include: advanced age (>30men, >89 women);hypertension;dyslipidemia;male gender     Objective:    There were no vitals filed for this visit. There is no height or weight on file to calculate BMI.  Advanced Directives 08/17/2020  Does Patient Have a Medical Advance Directive? Yes  Does patient want to make changes to medical advance directive? Yes (MAU/Ambulatory/Procedural Areas - Information given)    Current Medications (verified) Outpatient Encounter Medications as of 08/17/2020  Medication Sig  . aspirin 81 MG EC tablet 1 tablet  . famotidine (PEPCID) 10 MG tablet Take 10 mg by mouth 2 (two) times daily.  . Magnesium 250 MG TABS 1 tablet with a meal  . Multiple Vitamin (MULTIVITAMIN) tablet Take 1 tablet by mouth daily.  . pravastatin (PRAVACHOL) 40 MG tablet TAKE 1 TABLET EVERY DAY  . valsartan (DIOVAN) 80 MG tablet Take 1 tablet (80 mg total) by mouth daily.  . cholecalciferol (VITAMIN D3) 25 MCG (1000  UNIT) tablet 1 tablet  . magnesium 30 MG tablet Take 250 mg by mouth 2 (two) times daily. (Patient not taking: Reported on 08/17/2020)  . [DISCONTINUED] calcium-vitamin D (OSCAL WITH D) 500-200 MG-UNIT tablet Take 1 tablet by mouth. (Patient not taking: Reported on 08/17/2020)   No facility-administered encounter medications on file as of 08/17/2020.    Allergies (verified) Wellbutrin [bupropion]   History: Past Medical History:  Diagnosis Date  . Coronary artery calcification seen on CAT scan 06/29/2018   06/18 CT: Coronary and aortic atheresclerosis  . Hyperglycemia 06/29/2018  . Hypertension   . Mild hyperlipidemia 06/29/2018  . Moderate aortic regurgitation 06/29/2018  . Polio    Past Surgical History:  Procedure Laterality Date  . FISTULA PLUG     from tail bone   Family History  Problem Relation Age of Onset  . Heart attack Half-Brother    Social History   Socioeconomic History  . Marital status: Married    Spouse name: Not on file  . Number of children: 2  . Years of education: Not on file  . Highest education level: Not on file  Occupational History  . Not on file  Tobacco Use  . Smoking status: Former Smoker    Packs/day: 2.00    Years: 30.00    Pack years: 60.00    Types: Cigarettes    Start date: 03/26/1963    Quit date: 03/25/2014    Years since quitting: 6.4  . Smokeless tobacco: Never Used  Substance and Sexual Activity  .  Alcohol use: Yes    Alcohol/week: 1.0 standard drink    Types: 1 Cans of beer per week    Comment: occ  . Drug use: Never  . Sexual activity: Not on file  Other Topics Concern  . Not on file  Social History Narrative  . Not on file   Social Determinants of Health   Financial Resource Strain: Low Risk   . Difficulty of Paying Living Expenses: Not hard at all  Food Insecurity: No Food Insecurity  . Worried About Charity fundraiser in the Last Year: Never true  . Ran Out of Food in the Last Year: Never true  Transportation Needs:  No Transportation Needs  . Lack of Transportation (Medical): No  . Lack of Transportation (Non-Medical): No  Physical Activity: Sufficiently Active  . Days of Exercise per Week: 5 days  . Minutes of Exercise per Session: 30 min  Stress: No Stress Concern Present  . Feeling of Stress : Only a little  Social Connections: Moderately Integrated  . Frequency of Communication with Friends and Family: More than three times a week  . Frequency of Social Gatherings with Friends and Family: Once a week  . Attends Religious Services: 1 to 4 times per year  . Active Member of Clubs or Organizations: No  . Attends Archivist Meetings: Never  . Marital Status: Married    Tobacco Counseling Counseling given: Not Answered   Clinical Intake:  Pre-visit preparation completed: Yes  Pain : No/denies pain     BMI - recorded: 26.34 Nutritional Status: BMI 25 -29 Overweight Nutritional Risks: None Diabetes: No  How often do you need to have someone help you when you read instructions, pamphlets, or other written materials from your doctor or pharmacy?: 1 - Never  Diabetic? No  Interpreter Needed?: No  Information entered by :: Charlott Rakes, LPN   Activities of Daily Living In your present state of health, do you have any difficulty performing the following activities: 08/17/2020  Hearing? Y  Comment mild loss  Vision? N  Difficulty concentrating or making decisions? N  Walking or climbing stairs? N  Dressing or bathing? N  Doing errands, shopping? N  Preparing Food and eating ? N  Using the Toilet? N  In the past six months, have you accidently leaked urine? N  Do you have problems with loss of bowel control? N  Managing your Medications? N  Managing your Finances? N  Housekeeping or managing your Housekeeping? N  Some recent data might be hidden    Patient Care Team: Vivi Barrack, MD as PCP - General (Family Medicine)  Indicate any recent Medical Services you  may have received from other than [facility] providers in the past year (date may be approximate).     Assessment:   This is a routine wellness examination for [place].  Hearing/Vision screen  Hearing Screening   125Hz  250Hz  500Hz  1000Hz  2000Hz  3000Hz  4000Hz  6000Hz  8000Hz   Right ear:           Left ear:           Comments: Pt stated mild loss  Vision Screening Comments: Pt hasn't followed up and request an referral for eye dr  Dietary issues and exercise activities discussed: Current Exercise Habits: Home exercise routine, Type of exercise: treadmill;walking;Other - see comments (stair push ups), Time (Minutes): 30, Frequency (Times/Week): 5, Weekly Exercise (Minutes/Week): 150  Goals    . Patient Stated     Keep living  Depression Screen PHQ 2/9 Scores 08/17/2020 03/03/2020  PHQ - 2 Score 0 0    Fall Risk Fall Risk  08/17/2020  Falls in the past year? 0  Number falls in past yr: 0  Injury with Fall? 0  Risk for fall due to : Impaired vision  Follow up Falls prevention discussed    FALL RISK PREVENTION PERTAINING TO THE HOME:  Any stairs in or around the home? Yes  If so, are there any without handrails? No  Home free of loose throw rugs in walkways, pet beds, electrical cords, etc? Yes  Adequate lighting in your home to reduce risk of falls? Yes   ASSISTIVE DEVICES UTILIZED TO PREVENT FALLS:  Life alert? No  Use of a cane, walker or w/c? No  Grab bars in the bathroom? Yes  Shower chair or bench in shower? Yes  Elevated toilet seat or a handicapped toilet? Yes   TIMED UP AND GO:  Was the test performed? No .     Cognitive Function:     6CIT Screen 08/17/2020  What Year? 0 points  What month? 0 points  Count back from 20 0 points  Months in reverse 0 points  Repeat phrase 0 points    Immunizations Immunization History  Administered Date(s) Administered  . Influenza Split 12/21/2018  . Influenza-Unspecified 01/01/2020  . PFIZER(Purple Top)SARS-COV-2  Vaccination 06/23/2019, 07/23/2019, 01/31/2020, 08/10/2020  . Pneumococcal Conjugate-13 02/19/2014  . Pneumococcal Polysaccharide-23 02/11/2015  . Pneumococcal-Unspecified 04/26/2015  . Tdap 08/12/2015  . Zoster 08/12/2015  . Zoster Recombinat (Shingrix) 08/10/2020    TDAP status: Up to date  Flu Vaccine status: Up to date  Pneumococcal vaccine status: Up to date  Covid-19 vaccine status: Completed vaccines  Qualifies for Shingles Vaccine? Yes   Zostavax completed Yes   Shingrix Completed?: No.    Education has been provided regarding the importance of this vaccine. Patient has been advised to call insurance company to determine out of pocket expense if they have not yet received this vaccine. Advised may also receive vaccine at local pharmacy or Health Dept. Verbalized acceptance and understanding.  Screening Tests Health Maintenance  Topic Date Due  . Hepatitis C Screening  Never done  . INFLUENZA VACCINE  11/23/2020  . COLONOSCOPY (Pts 45-64yrs Insurance coverage will need to be confirmed)  04/26/2023  . TETANUS/TDAP  08/11/2025  . COVID-19 Vaccine  Completed  . PNA vac Low Risk Adult  Completed  . HPV VACCINES  Aged Out    Health Maintenance  Health Maintenance Due  Topic Date Due  . Hepatitis C Screening  Never done    Colorectal cancer screening: Type of screening: Colonoscopy. Completed 02/11/15. Repeat every 10 years   Additional Screening:  Hepatitis C Screening: does qualify;   Vision Screening: Recommended annual ophthalmology exams for early detection of glaucoma and other disorders of the eye. Is the patient up to date with their annual eye exam?  No  Who is the provider or what is the name of the office in which the patient attends annual eye exams? Request referral it's been 2 years since exams  If pt is not established with a provider, would they like to be referred to a provider to establish care? Yes .   Dental Screening: Recommended annual dental  exams for proper oral hygiene  Community Resource Referral / Chronic Care Management: CRR required this visit?  No   CCM required this visit?  No      Plan:  I have personally reviewed and noted the following in the patient's chart:   . Medical and social history . Use of alcohol, tobacco or illicit drugs  . Current medications and supplements . Functional ability and status . Nutritional status . Physical activity . Advanced directives . List of other physicians . Hospitalizations, surgeries, and ER visits in previous 12 months . Vitals . Screenings to include cognitive, depression, and falls . Referrals and appointments  In addition, I have reviewed and discussed with patient certain preventive protocols, quality metrics, and best practice recommendations. A written personalized care plan for preventive services as well as general preventive health recommendations were provided to patient.     Willette Brace, LPN   1/[id number]/[id number]   Nurse Notes: pt request Referral for eye Dr

## 2020-08-26 ENCOUNTER — Other Ambulatory Visit: Payer: Self-pay | Admitting: *Deleted

## 2020-08-26 DIAGNOSIS — Z122 Encounter for screening for malignant neoplasm of respiratory organs: Secondary | ICD-10-CM

## 2020-09-01 ENCOUNTER — Other Ambulatory Visit: Payer: Self-pay | Admitting: *Deleted

## 2020-09-01 DIAGNOSIS — Z87891 Personal history of nicotine dependence: Secondary | ICD-10-CM

## 2020-10-19 ENCOUNTER — Other Ambulatory Visit: Payer: Self-pay | Admitting: Family Medicine

## 2020-10-27 ENCOUNTER — Ambulatory Visit
Admission: RE | Admit: 2020-10-27 | Discharge: 2020-10-27 | Disposition: A | Discharge status: Home / Self Care | Payer: Medicare Other | Source: Ambulatory Visit | Attending: Acute Care | Admitting: Acute Care

## 2020-10-27 DIAGNOSIS — Z87891 Personal history of nicotine dependence: Secondary | ICD-10-CM | POA: Diagnosis not present

## 2020-10-29 NOTE — Progress Notes (Signed)
Please call patient and let them  know their  low dose Ct was read as a Lung RADS 2: nodules that are benign in appearance and behavior with a very low likelihood of becoming a clinically active cancer due to size or lack of growth. Recommendation per radiology is for a repeat LDCT in 12 months. .Please let them  know we will order and schedule their  annual screening scan for 10/2021. Please let them  know there was notation of CAD on their  scan.  Please remind the patient  that this is a non-gated exam therefore degree or severity of disease  cannot be determined. Please have them  follow up with their PCP regarding potential risk factor modification, dietary therapy or pharmacologic therapy if clinically indicated. Pt.  is  currently on statin therapy. Please place order for annual  screening scan for  10/2021 and fax results to PCP. Thanks so much. 

## 2020-11-02 ENCOUNTER — Encounter: Payer: Self-pay | Admitting: *Deleted

## 2020-11-02 DIAGNOSIS — Z87891 Personal history of nicotine dependence: Secondary | ICD-10-CM

## 2020-11-27 ENCOUNTER — Ambulatory Visit: Payer: Medicare Other | Admitting: Family Medicine

## 2020-12-16 ENCOUNTER — Other Ambulatory Visit: Payer: Self-pay | Admitting: *Deleted

## 2020-12-16 MED ORDER — VALSARTAN 80 MG PO TABS: 80.0000 mg | ORAL_TABLET | Freq: Every day | ORAL | 0 refills | Status: DC

## 2021-01-21 DIAGNOSIS — L570 Actinic keratosis: Secondary | ICD-10-CM | POA: Diagnosis not present

## 2021-01-21 DIAGNOSIS — L821 Other seborrheic keratosis: Secondary | ICD-10-CM | POA: Diagnosis not present

## 2021-01-21 DIAGNOSIS — Z85828 Personal history of other malignant neoplasm of skin: Secondary | ICD-10-CM | POA: Diagnosis not present

## 2021-01-21 DIAGNOSIS — D485 Neoplasm of uncertain behavior of skin: Secondary | ICD-10-CM | POA: Diagnosis not present

## 2021-01-21 DIAGNOSIS — L814 Other melanin hyperpigmentation: Secondary | ICD-10-CM | POA: Diagnosis not present

## 2021-02-04 DIAGNOSIS — Z23 Encounter for immunization: Secondary | ICD-10-CM | POA: Diagnosis not present

## 2021-02-28 ENCOUNTER — Other Ambulatory Visit: Payer: Self-pay | Admitting: Family Medicine

## 2021-03-10 ENCOUNTER — Other Ambulatory Visit: Payer: Self-pay | Admitting: Family Medicine

## 2021-04-22 ENCOUNTER — Other Ambulatory Visit: Payer: Self-pay | Admitting: Family Medicine

## 2021-06-29 ENCOUNTER — Other Ambulatory Visit: Payer: Self-pay | Admitting: Family Medicine

## 2021-08-04 ENCOUNTER — Other Ambulatory Visit: Payer: Self-pay | Admitting: Family Medicine

## 2021-08-06 ENCOUNTER — Telehealth: Payer: Self-pay | Admitting: Family Medicine

## 2021-08-06 NOTE — Telephone Encounter (Signed)
Pt is needing his refill of his rx. He has been scheduled for an annual exam on 08/12/21. Please advise. ? ?MEDICATION:pravastatin (PRAVACHOL) 40 MG tablet ? ?PHARMACY: ?[place], [place] Phone:  [phone]  ?Fax:  [phone]  ?  ? ?

## 2021-08-06 NOTE — Telephone Encounter (Signed)
Tried to call pt back to advise he will need an appointment to receive refills on medication and phone number on file is disconnected. Please advise pt if he calls back he will need an appointment before we can refill medication. Patient last seen in 2021 ?

## 2021-08-06 NOTE — Telephone Encounter (Signed)
Pt does have an appt scheduled for 08/12/21. I have contacted pharmacy to see if they have an updated phone number for this pt. I have tried to call both numbers and LVM to call our office. ?Home#: [phone] ?Phone#: [phone] ?

## 2021-08-12 ENCOUNTER — Ambulatory Visit (INDEPENDENT_AMBULATORY_CARE_PROVIDER_SITE_OTHER): Payer: Medicare Other | Admitting: Family Medicine

## 2021-08-12 ENCOUNTER — Encounter: Payer: Self-pay | Admitting: Family Medicine

## 2021-08-12 VITALS — BP 134/78 | HR 69 | Temp 98.4°F | Ht 67.0 in | Wt 167.8 lb

## 2021-08-12 DIAGNOSIS — R739 Hyperglycemia, unspecified: Secondary | ICD-10-CM | POA: Diagnosis not present

## 2021-08-12 DIAGNOSIS — I10 Essential (primary) hypertension: Secondary | ICD-10-CM | POA: Diagnosis not present

## 2021-08-12 DIAGNOSIS — E785 Hyperlipidemia, unspecified: Secondary | ICD-10-CM

## 2021-08-12 LAB — COMPREHENSIVE METABOLIC PANEL
ALT: 36 U/L (ref 0–53)
AST: 30 U/L (ref 0–37)
Albumin: 4.4 g/dL (ref 3.5–5.2)
Alkaline Phosphatase: 31 U/L — ABNORMAL LOW (ref 39–117)
BUN: 21 mg/dL (ref 6–23)
CO2: 27 mEq/L (ref 19–32)
Calcium: 9.1 mg/dL (ref 8.4–10.5)
Chloride: 104 mEq/L (ref 96–112)
Creatinine, Ser: 1.15 mg/dL (ref 0.40–1.50)
GFR: 62.77 mL/min (ref >60.00)
Glucose, Bld: 95 mg/dL (ref 70–99)
Potassium: 4.3 mEq/L (ref 3.5–5.1)
Sodium: 139 mEq/L (ref 135–145)
Total Bilirubin: 0.8 mg/dL (ref 0.2–1.2)
Total Protein: 7 g/dL (ref 6.0–8.3)

## 2021-08-12 LAB — CBC
HCT: 41.5 % (ref 39.0–52.0)
Hemoglobin: 13.9 g/dL (ref 13.0–17.0)
MCHC: 33.4 g/dL (ref 30.0–36.0)
MCV: 92.8 fl (ref 78.0–100.0)
Platelets: 174 10*3/uL (ref 150.0–400.0)
RBC: 4.47 Mil/uL (ref 4.22–5.81)
RDW: 12.8 % (ref 11.5–15.5)
WBC: 6.4 10*3/uL (ref 4.0–10.5)

## 2021-08-12 LAB — LIPID PANEL
Cholesterol: 118 mg/dL (ref 0–200)
HDL: 45.6 mg/dL (ref >39.00)
LDL Cholesterol: 52 mg/dL (ref 0–99)
NonHDL: 72.59
Total CHOL/HDL Ratio: 3
Triglycerides: 102 mg/dL (ref 0.0–149.0)
VLDL: 20.4 mg/dL (ref 0.0–40.0)

## 2021-08-12 LAB — HEMOGLOBIN A1C: Hgb A1c MFr Bld: 5.8 % (ref 4.6–6.5)

## 2021-08-12 LAB — TSH: TSH: 2.38 u[IU]/mL (ref 0.35–5.50)

## 2021-08-12 NOTE — Assessment & Plan Note (Signed)
Check A1c. 

## 2021-08-12 NOTE — Assessment & Plan Note (Signed)
At goal on valsartan 80 mg daily.  Check labs. ?

## 2021-08-12 NOTE — Patient Instructions (Signed)
It was very nice to see you today! ? ?We we will check blood work today.  Please continue to work on diet and exercise. ? ?I will see back in year for your next physical.  Come back to see me sooner if needed. ? ?Take care, ?Dr Jerline Pain ? ?PLEASE NOTE: ? ?If you had any lab tests please let us know if you have not heard back within a few days. You may see your results on mychart before we have a chance to review them but we will give you a call once they are reviewed by [place]. If we ordered any referrals today, please let us know if you have not heard from their office within the next week.  ? ?Please try these tips to maintain a healthy lifestyle: ? ?Eat at least 3 REAL meals and 1-2 snacks per day.  Aim for no more than 5 hours between eating.  If you eat breakfast, please do so within one hour of getting up.  ? ?Each meal should contain half fruits/vegetables, one quarter protein, and one quarter carbs (no bigger than a computer mouse) ? ?Cut down on sweet beverages. This includes juice, soda, and sweet tea.  ? ?Drink at least 1 glass of water with each meal and aim for at least 8 glasses per day ? ?Exercise at least 150 minutes every week.   ?

## 2021-08-12 NOTE — Progress Notes (Signed)
? ?  Gary Martin is a 76 y.o. male who presents today for an office visit. ? ?Assessment/Plan:  ?Chronic Problems Addressed Today: ?Dyslipidemia ?On pravastatin 40 mg daily.  We will check lipids.  Discussed lifestyle modifications. ? ?Essential hypertension ?At goal on valsartan 80 mg daily.  Check labs. ? ?Hyperglycemia ?Check A1c. ? ?Preventative Healthcare ?Recently got shingles vaccine.  We will check labs today.  Up-to-date on colon cancer screening. ? ?  ?Subjective:  ?HPI: ? ?See A/p for status of chronic conditions.  He is doing well today.  No acute concerns. ? ?   ?  ?Objective:  ?Physical Exam: ?BP 134/78   Pulse 69   Temp 98.4 ?F (36.9 ?C) (Temporal)   Ht '5\' 7"'$  (1.702 m)   Wt 167 lb 12.8 oz (76.1 kg)   SpO2 98%   BMI 26.28 kg/m?   ?Gen: No acute distress, resting comfortably ?CV: Regular rate and rhythm with no murmurs appreciated ?Pulm: Normal work of breathing, clear to auscultation bilaterally with no crackles, wheezes, or rhonchi ?Neuro: Grossly normal, moves all extremities ?Psych: Normal affect and thought content ? ?   ? ?Gary Martin. Jerline Pain, MD ?08/12/2021 2:03 PM  ? ?

## 2021-08-12 NOTE — Assessment & Plan Note (Signed)
On pravastatin 40 mg daily.  We will check lipids.  Discussed lifestyle modifications. ?

## 2021-08-13 ENCOUNTER — Telehealth: Payer: Self-pay | Admitting: Family Medicine

## 2021-08-13 NOTE — Telephone Encounter (Signed)
See lab notes

## 2021-08-13 NOTE — Telephone Encounter (Signed)
Pt returned a call to our office regarding labs and would like a call back ?

## 2021-08-13 NOTE — Progress Notes (Signed)
Please inform patient of the following: ? ?Good news! Labs are all stable. Would like for him to keep up the good work and we can recheck in a year. ? ?[name]. Jerline Pain, MD ?08/13/2021 12:35 PM  ?

## 2021-08-19 ENCOUNTER — Other Ambulatory Visit: Payer: Self-pay | Admitting: Family Medicine

## 2021-08-20 ENCOUNTER — Ambulatory Visit (INDEPENDENT_AMBULATORY_CARE_PROVIDER_SITE_OTHER): Payer: Medicare Other

## 2021-08-20 DIAGNOSIS — Z0000 Encounter for general adult medical examination without abnormal findings: Secondary | ICD-10-CM | POA: Diagnosis not present

## 2021-08-20 NOTE — Progress Notes (Addendum)
Virtual Visit via Telephone Note ? ?I connected with  Gary Martin on 08/20/21 at  3:15 PM EDT by telephone and verified that I am speaking with the correct person using two identifiers. ? ?Medicare Annual Wellness visit completed telephonically due to Covid-19 pandemic.  ? ?Persons participating in this call: This Health Coach and this patient.  ? ?Location: ?Patient: Home ?Provider: office ?  ?I discussed the limitations, risks, security and privacy concerns of performing an evaluation and management service by telephone and the availability of in person appointments. The patient expressed understanding and agreed to proceed. ? ?Unable to perform video visit due to video visit attempted and failed and/or patient does not have video capability.  ? ?Some vital signs may be absent or patient reported.  ? ?Willette Brace, LPN ? ? ?Subjective:  ? Gary Martin is a 76 y.o. male who presents for Medicare Annual/Subsequent preventive examination. ? ?Review of Systems    ? ?Cardiac Risk Factors include: advanced age (>40mn, >>18women);hypertension;dyslipidemia;male gender ? ?   ?Objective:  ?  ?There were no vitals filed for this visit. ?There is no height or weight on file to calculate BMI. ? ? ?  08/20/2021  ?  3:05 PM 08/17/2020  ?  3:30 PM  ?Advanced Directives  ?Does Patient Have a Medical Advance Directive? No Yes  ?Does patient want to make changes to medical advance directive?  Yes (MAU/Ambulatory/Procedural Areas - Information given)  ?Would patient like information on creating a medical advance directive? No - Patient declined   ? ? ?Current Medications (verified) ?Outpatient Encounter Medications as of 08/20/2021  ?Medication Sig  ? aspirin 81 MG EC tablet 1 tablet  ? calcium carbonate (OSCAL) 1500 (600 Ca) MG TABS tablet Take by mouth 2 (two) times daily with a meal.  ? famotidine (PEPCID) 10 MG tablet Take 10 mg by mouth 2 (two) times daily.  ? Magnesium 250 MG TABS 1 tablet with a meal  ? Multiple Vitamin  (MULTIVITAMIN) tablet Take 1 tablet by mouth daily.  ? pravastatin (PRAVACHOL) 40 MG tablet TAKE 1 TABLET EVERY DAY (NEED APPOINTMENT FOR FURTHER REFILLS)  ? valsartan (DIOVAN) 80 MG tablet TAKE 1 TABLET EVERY DAY  ? [DISCONTINUED] cholecalciferol (VITAMIN D3) 25 MCG (1000 UNIT) tablet 1 tablet  ? ?No facility-administered encounter medications on file as of 08/20/2021.  ? ? ?Allergies (verified) ?Wellbutrin [bupropion]  ? ?History: ?Past Medical History:  ?Diagnosis Date  ? Coronary artery calcification seen on CAT scan 06/29/2018  ? 06/18 CT: Coronary and aortic atheresclerosis  ? Hyperglycemia 06/29/2018  ? Hypertension   ? Mild hyperlipidemia 06/29/2018  ? Moderate aortic regurgitation 06/29/2018  ? Polio   ? ?Past Surgical History:  ?Procedure Laterality Date  ? FISTULA PLUG    ? from tail bone  ? ?Family History  ?Problem Relation Age of Onset  ? Heart attack Half-Brother   ? ?Social History  ? ?Socioeconomic History  ? Marital status: Married  ?  Spouse name: Not on file  ? Number of children: 2  ? Years of education: Not on file  ? Highest education level: Not on file  ?Occupational History  ? Not on file  ?Tobacco Use  ? Smoking status: Former  ?  Packs/day: 2.00  ?  Years: 30.00  ?  Pack years: 60.00  ?  Types: Cigarettes  ?  Start date: 03/26/1963  ?  Quit date: 03/25/2014  ?  Years since quitting: 7.4  ? Smokeless tobacco:  Never  ?Substance and Sexual Activity  ? Alcohol use: Yes  ?  Alcohol/week: 1.0 standard drink  ?  Types: 1 Cans of beer per week  ?  Comment: occ  ? Drug use: Never  ? Sexual activity: Not on file  ?Other Topics Concern  ? Not on file  ?Social History Narrative  ? Not on file  ? ?Social Determinants of Health  ? ?Financial Resource Strain: Low Risk   ? Difficulty of Paying Living Expenses: Not hard at all  ?Food Insecurity: No Food Insecurity  ? Worried About Charity fundraiser in the Last Year: Never true  ? Ran Out of Food in the Last Year: Never true  ?Transportation Needs: No Transportation  Needs  ? Lack of Transportation (Medical): No  ? Lack of Transportation (Non-Medical): No  ?Physical Activity: Sufficiently Active  ? Days of Exercise per Week: 5 days  ? Minutes of Exercise per Session: 50 min  ?Stress: No Stress Concern Present  ? Feeling of Stress : Not at all  ?Social Connections: Moderately Integrated  ? Frequency of Communication with Friends and Family: More than three times a week  ? Frequency of Social Gatherings with Friends and Family: More than three times a week  ? Attends Religious Services: 1 to 4 times per year  ? Active Member of Clubs or Organizations: No  ? Attends Archivist Meetings: Never  ? Marital Status: Married  ? ? ?Tobacco Counseling ?Counseling given: Not Answered ? ? ?Clinical Intake: ? ?Pre-visit preparation completed: Yes ? ?Pain : No/denies pain ? ?  ? ?BMI - recorded: 26.28 ?Nutritional Status: BMI 25 -29 Overweight ?Nutritional Risks: None ?Diabetes: No ? ?How often do you need to have someone help you when you read instructions, pamphlets, or other written materials from your doctor or pharmacy?: 1 - Never ? ?Diabetic?no ? ?Interpreter Needed?: No ? ?Information entered by :: Charlott Rakes, LPN ? ? ?Activities of Daily Living ? ?  08/20/2021  ?  3:07 PM  ?In your present state of health, do you have any difficulty performing the following activities:  ?Hearing? 0  ?Vision? 0  ?Difficulty concentrating or making decisions? 0  ?Walking or climbing stairs? 0  ?Dressing or bathing? 0  ?Doing errands, shopping? 0  ?Preparing Food and eating ? N  ?Using the Toilet? N  ?In the past six months, have you accidently leaked urine? N  ?Do you have problems with loss of bowel control? N  ?Managing your Medications? N  ?Managing your Finances? N  ?Housekeeping or managing your Housekeeping? N  ? ? ?Patient Care Team: ?Vivi Barrack, MD as PCP - General (Family Medicine) ? ?Indicate any recent Medical Services you may have received from other than [facility] providers in  the past year (date may be approximate). ? ?   ?Assessment:  ? This is a routine wellness examination for [facility]. ? ?Hearing/Vision screen ?Hearing Screening - Comments:: Pt denies any hearing issues  ?Vision Screening - Comments:: Pt encouraged to follow up with eye exams  ? ?Dietary issues and exercise activities discussed: ?Current Exercise Habits: Home exercise routine, Type of exercise: walking, Time (Minutes): 50, Frequency (Times/Week): 5, Weekly Exercise (Minutes/Week): 250 ? ? Goals Addressed   ? ?  ?  ?  ?  ? This Visit's Progress  ?  Patient Stated     ?  None at this time  ?  ? ?  ? ?Depression Screen ? ?  08/20/2021  ?  3:04 PM 08/12/2021  ?  1:37 PM 08/17/2020  ?  3:27 PM 03/03/2020  ?  1:51 PM  ?PHQ 2/9 Scores  ?PHQ - 2 Score 0 0 0 0  ?  ?Fall Risk ? ?  08/20/2021  ?  3:07 PM 08/12/2021  ?  1:38 PM 08/17/2020  ?  3:31 PM  ?Fall Risk   ?Falls in the past year? 0 0 0  ?Number falls in past yr: 0 0 0  ?Injury with Fall? 0 0 0  ?Risk for fall due to : Impaired vision No Fall Risks Impaired vision  ?Follow up Falls prevention discussed  Falls prevention discussed  ? ? ?FALL RISK PREVENTION PERTAINING TO THE HOME: ? ?Any stairs in or around the home? Yes  ?If so, are there any without handrails? No  ?Home free of loose throw rugs in walkways, pet beds, electrical cords, etc? Yes  ?Adequate lighting in your home to reduce risk of falls? Yes  ? ?ASSISTIVE DEVICES UTILIZED TO PREVENT FALLS: ? ?Life alert? No  ?Use of a cane, walker or w/c? No  ?Grab bars in the bathroom? Yes  ?Shower chair or bench in shower? Yes  ?Elevated toilet seat or a handicapped toilet? No  ? ?TIMED UP AND GO: ? ?Was the test performed? No .  ? ?Cognitive Function: ?  ?  ? ?  08/20/2021  ?  3:07 PM 08/17/2020  ?  3:34 PM  ?6CIT Screen  ?What Year? 0 points 0 points  ?What month? 0 points 0 points  ?What time? 0 points   ?Count back from 20 0 points 0 points  ?Months in reverse 0 points 0 points  ?Repeat phrase 0 points 0 points  ?Total Score 0  points   ? ? ?Immunizations ?Immunization History  ?Administered Date(s) Administered  ? Influenza Split 12/21/2018  ? Influenza-Unspecified 01/01/2020  ? PFIZER(Purple Top)SARS-COV-2 Vaccination 02/2

## 2021-08-20 NOTE — Patient Instructions (Signed)
[name] , ?Thank you for taking time to come for your Medicare Wellness Visit. I appreciate your ongoing commitment to your health goals. Please review the following plan we discussed and let me know if I can assist you in the future.  ? ?Screening recommendations/referrals: ?Colonoscopy: done 02/11/15 repeat every 10 years  ?Recommended yearly ophthalmology/optometry visit for glaucoma screening and checkup ?Recommended yearly dental visit for hygiene and checkup ? ?Vaccinations: ?Influenza vaccine: Done 02/04/21 repeat every year  ?Pneumococcal vaccine: Up to date ?Tdap vaccine: Done 07/01/16 repeat every 10 years  ?Shingles vaccine: Completed 4/18, 11/02/20   ?Covid-19: Completed 2/28, 3/30, 01/31/20 & 08/10/20 ? ?Advanced directives: Advance directive discussed with you today. Even though you declined this today please call our office should you change your mind and we can give you the proper paperwork for you to fill out. ? ?Conditions/risks identified: None at this time ? ? ?Next appointment: Follow up in one year for your annual wellness visit.  ? ?Preventive Care 75 Years and Older, Male ?Preventive care refers to lifestyle choices and visits with your health care provider that can promote health and wellness. ?What does preventive care include? ?A yearly physical exam. This is also called an annual well check. ?Dental exams once or twice a year. ?Routine eye exams. Ask your health care provider how often you should have your eyes checked. ?Personal lifestyle choices, including: ?Daily care of your teeth and gums. ?Regular physical activity. ?Eating a healthy diet. ?Avoiding tobacco and drug use. ?Limiting alcohol use. ?Practicing safe sex. ?Taking low doses of aspirin every day. ?Taking vitamin and mineral supplements as recommended by your health care provider. ?What happens during an annual well check? ?The services and screenings done by your health care provider during your annual well check will depend  on your age, overall health, lifestyle risk factors, and family history of disease. ?Counseling  ?Your health care provider may ask you questions about your: ?Alcohol use. ?Tobacco use. ?Drug use. ?Emotional well-being. ?Home and relationship well-being. ?Sexual activity. ?Eating habits. ?History of falls. ?Memory and ability to understand (cognition). ?Work and work Statistician. ?Screening  ?You may have the following tests or measurements: ?Height, weight, and BMI. ?Blood pressure. ?Lipid and cholesterol levels. These may be checked every 5 years, or more frequently if you are over 60 years old. ?Skin check. ?Lung cancer screening. You may have this screening every year starting at age 75 if you have a 30-pack-year history of smoking and currently smoke or have quit within the past 15 years. ?Fecal occult blood test (FOBT) of the stool. You may have this test every year starting at age 75. ?Flexible sigmoidoscopy or colonoscopy. You may have a sigmoidoscopy every 5 years or a colonoscopy every 10 years starting at age 75. ?Prostate cancer screening. Recommendations will vary depending on your family history and other risks. ?Hepatitis C blood test. ?Hepatitis B blood test. ?Sexually transmitted disease (STD) testing. ?Diabetes screening. This is done by checking your blood sugar (glucose) after you have not eaten for a while (fasting). You may have this done every 1-3 years. ?Abdominal aortic aneurysm (AAA) screening. You may need this if you are a current or former smoker. ?Osteoporosis. You may be screened starting at age 75 if you are at high risk. ?Talk with your health care provider about your test results, treatment options, and if necessary, the need for more tests. ?Vaccines  ?Your health care provider may recommend certain vaccines, such as: ?Influenza vaccine. This is  recommended every year. ?Tetanus, diphtheria, and acellular pertussis (Tdap, Td) vaccine. You may need a Td booster every 10  years. ?Zoster vaccine. You may need this after age 75. ?Pneumococcal 13-valent conjugate (PCV13) vaccine. One dose is recommended after age 75. ?Pneumococcal polysaccharide (PPSV23) vaccine. One dose is recommended after age 75. ?Talk to your health care provider about which screenings and vaccines you need and how often you need them. ?This information is not intended to replace advice given to you by your health care provider. Make sure you discuss any questions you have with your health care provider. ?Document Released: 05/08/2015 Document Revised: 12/30/2015 Document Reviewed: 02/10/2015 ?Elsevier Interactive Patient Education ? 2017 [employer]. ? ?Fall Prevention in the Home ?Falls can cause injuries. They can happen to people of all ages. There are many things you can do to make your home safe and to help prevent falls. ?What can I do on the outside of my home? ?Regularly fix the edges of walkways and driveways and fix any cracks. ?Remove anything that might make you trip as you walk through a door, such as a raised step or threshold. ?Trim any bushes or trees on the path to your home. ?Use bright outdoor lighting. ?Clear any walking paths of anything that might make someone trip, such as rocks or tools. ?Regularly check to see if handrails are loose or broken. Make sure that both sides of any steps have handrails. ?Any raised decks and porches should have guardrails on the edges. ?Have any leaves, snow, or ice cleared regularly. ?Use sand or salt on walking paths during winter. ?Clean up any spills in your garage right away. This includes oil or grease spills. ?What can I do in the bathroom? ?Use night lights. ?Install grab bars by the toilet and in the tub and shower. Do not use towel bars as grab bars. ?Use non-skid mats or decals in the tub or shower. ?If you need to sit down in the shower, use a plastic, non-slip stool. ?Keep the floor dry. Clean up any water that spills on the floor as soon as it  happens. ?Remove soap buildup in the tub or shower regularly. ?Attach bath mats securely with double-sided non-slip rug tape. ?Do not have throw rugs and other things on the floor that can make you trip. ?What can I do in the bedroom? ?Use night lights. ?Make sure that you have a light by your bed that is easy to reach. ?Do not use any sheets or blankets that are too big for your bed. They should not hang down onto the floor. ?Have a firm chair that has side arms. You can use this for support while you get dressed. ?Do not have throw rugs and other things on the floor that can make you trip. ?What can I do in the kitchen? ?Clean up any spills right away. ?Avoid walking on wet floors. ?Keep items that you use a lot in easy-to-reach places. ?If you need to reach something above you, use a strong step stool that has a grab bar. ?Keep electrical cords out of the way. ?Do not use floor polish or wax that makes floors slippery. If you must use wax, use non-skid floor wax. ?Do not have throw rugs and other things on the floor that can make you trip. ?What can I do with my stairs? ?Do not leave any items on the stairs. ?Make sure that there are handrails on both sides of the stairs and use them. Fix handrails that are  broken or loose. Make sure that handrails are as long as the stairways. ?Check any carpeting to make sure that it is firmly attached to the stairs. Fix any carpet that is loose or worn. ?Avoid having throw rugs at the top or bottom of the stairs. If you do have throw rugs, attach them to the floor with carpet tape. ?Make sure that you have a light switch at the top of the stairs and the bottom of the stairs. If you do not have them, ask someone to add them for you. ?What else can I do to help prevent falls? ?Wear shoes that: ?Do not have high heels. ?Have rubber bottoms. ?Are comfortable and fit you well. ?Are closed at the toe. Do not wear sandals. ?If you use a stepladder: ?Make sure that it is fully opened.  Do not climb a closed stepladder. ?Make sure that both sides of the stepladder are locked into place. ?Ask someone to hold it for you, if possible. ?Clearly mark and make sure that you can see: ?Any grab bars or han

## 2021-08-20 NOTE — Progress Notes (Signed)
Virtual Visit via Telephone Note ? ?I connected with  Gary Martin on 08/20/21 at  3:15 PM EDT by telephone and verified that I am speaking with the correct person using two identifiers. ? ?Medicare Annual Wellness visit completed telephonically due to Covid-19 pandemic.  ? ?Persons participating in this call: This Health Coach and this patient.  ? ?Location: ?Patient: Home ?Provider: office ?  ?I discussed the limitations, risks, security and privacy concerns of performing an evaluation and management service by telephone and the availability of in person appointments. The patient expressed understanding and agreed to proceed. ? ?Unable to perform video visit due to video visit attempted and failed and/or patient does not have video capability.  ? ?Some vital signs may be absent or patient reported.  ? ?Willette Brace, LPN ? ?

## 2021-09-06 ENCOUNTER — Other Ambulatory Visit: Payer: Self-pay | Admitting: *Deleted

## 2021-09-06 DIAGNOSIS — Z122 Encounter for screening for malignant neoplasm of respiratory organs: Secondary | ICD-10-CM

## 2021-09-06 DIAGNOSIS — Z87891 Personal history of nicotine dependence: Secondary | ICD-10-CM

## 2021-10-27 ENCOUNTER — Other Ambulatory Visit: Payer: Medicare Other

## 2021-11-15 ENCOUNTER — Other Ambulatory Visit: Payer: Medicare Other

## 2021-11-22 ENCOUNTER — Other Ambulatory Visit: Payer: Self-pay | Admitting: Family Medicine

## 2021-12-09 ENCOUNTER — Ambulatory Visit
Admission: RE | Admit: 2021-12-09 | Discharge: 2021-12-09 | Disposition: A | Discharge status: Home / Self Care | Payer: Medicare Other | Source: Ambulatory Visit | Attending: Acute Care | Admitting: Acute Care

## 2021-12-09 DIAGNOSIS — I358 Other nonrheumatic aortic valve disorders: Secondary | ICD-10-CM | POA: Diagnosis not present

## 2021-12-09 DIAGNOSIS — Z87891 Personal history of nicotine dependence: Secondary | ICD-10-CM | POA: Diagnosis not present

## 2021-12-09 DIAGNOSIS — I2510 Atherosclerotic heart disease of native coronary artery without angina pectoris: Secondary | ICD-10-CM | POA: Diagnosis not present

## 2021-12-09 DIAGNOSIS — Z122 Encounter for screening for malignant neoplasm of respiratory organs: Secondary | ICD-10-CM

## 2021-12-09 DIAGNOSIS — K8020 Calculus of gallbladder without cholecystitis without obstruction: Secondary | ICD-10-CM | POA: Diagnosis not present

## 2021-12-13 ENCOUNTER — Other Ambulatory Visit: Payer: Self-pay | Admitting: Acute Care

## 2021-12-13 DIAGNOSIS — Z87891 Personal history of nicotine dependence: Secondary | ICD-10-CM

## 2021-12-13 DIAGNOSIS — Z122 Encounter for screening for malignant neoplasm of respiratory organs: Secondary | ICD-10-CM

## 2022-01-13 ENCOUNTER — Other Ambulatory Visit: Payer: Self-pay | Admitting: Family Medicine

## 2022-01-17 ENCOUNTER — Encounter: Payer: Self-pay | Admitting: *Deleted

## 2022-01-24 DIAGNOSIS — L821 Other seborrheic keratosis: Secondary | ICD-10-CM | POA: Diagnosis not present

## 2022-01-24 DIAGNOSIS — L814 Other melanin hyperpigmentation: Secondary | ICD-10-CM | POA: Diagnosis not present

## 2022-01-24 DIAGNOSIS — Z85828 Personal history of other malignant neoplasm of skin: Secondary | ICD-10-CM | POA: Diagnosis not present

## 2022-01-24 DIAGNOSIS — L218 Other seborrheic dermatitis: Secondary | ICD-10-CM | POA: Diagnosis not present

## 2022-01-24 DIAGNOSIS — Z23 Encounter for immunization: Secondary | ICD-10-CM | POA: Diagnosis not present

## 2022-01-24 DIAGNOSIS — D692 Other nonthrombocytopenic purpura: Secondary | ICD-10-CM | POA: Diagnosis not present

## 2022-01-24 DIAGNOSIS — C44219 Basal cell carcinoma of skin of left ear and external auricular canal: Secondary | ICD-10-CM | POA: Diagnosis not present

## 2022-03-09 DIAGNOSIS — C44219 Basal cell carcinoma of skin of left ear and external auricular canal: Secondary | ICD-10-CM | POA: Diagnosis not present

## 2022-03-09 DIAGNOSIS — Z85828 Personal history of other malignant neoplasm of skin: Secondary | ICD-10-CM | POA: Diagnosis not present

## 2022-04-07 ENCOUNTER — Encounter: Payer: Self-pay | Admitting: Cardiovascular Disease

## 2022-04-07 ENCOUNTER — Encounter: Payer: Self-pay | Admitting: *Deleted

## 2022-04-07 DIAGNOSIS — Z79899 Other long term (current) drug therapy: Secondary | ICD-10-CM

## 2022-04-07 DIAGNOSIS — I10 Essential (primary) hypertension: Secondary | ICD-10-CM

## 2022-04-07 NOTE — Progress Notes (Signed)
Cardiology Office Note:    Date:  04/08/2022   ID:  Gary Martin, DOB 10-16-1945, MRN [id number]  PCP:  Gary Barrack, MD   [place] Prov[facility] Cardiologist:  New to Gary Martin    Referring MD: Gary Barrack, MD   Chief Complaint  Patient presents with   Chest Pain     History of Present Illness:    Gary Martin is a 76 y.o. male with a hx of coronary calcium , HTN,  HLD, moderate AI   Husband of Gary Martin ( another patient )   Has been told about a hear murmur  No CP or dyspnea  Gets regular exercise - treadmill , walks a mile a day , 5 days a week   No syncopal episodes   Long time smoker  Quit 5-6 years ago   Hx of HLD  Labs from primary were reviewed  Last LDL is 52.   Trigs = 102         Past Medical History:  Diagnosis Date   Coronary artery calcification seen on CAT scan 06/29/2018   06/18 CT: Coronary and aortic atheresclerosis   Hyperglycemia 06/29/2018   Hypertension    Mild hyperlipidemia 06/29/2018   Moderate aortic regurgitation 06/29/2018   Polio     Past Surgical History:  Procedure Laterality Date   FISTULA PLUG     from tail bone    Current Medications: Current Meds  Medication Sig   aspirin 81 MG EC tablet 1 tablet   calcium carbonate (OSCAL) 1500 (600 Ca) MG TABS tablet Take 1,500 mg by mouth daily.   Cholecalciferol (VITAMIN D3 SUPER STRENGTH) 50 MCG (2000 UT) TABS Take 2,000 Units by mouth daily.   famotidine (PEPCID) 10 MG tablet Take 10 mg by mouth daily.   Magnesium 250 MG TABS 1 tablet with a meal   Multiple Vitamin (MULTIVITAMIN) tablet Take 1 tablet by mouth daily.   pravastatin (PRAVACHOL) 40 MG tablet TAKE 1 TABLET EVERY DAY (NEED APPOINTMENT FOR FURTHER REFILLS)   valsartan (DIOVAN) 80 MG tablet TAKE 1 TABLET EVERY DAY     Allergies:   Wellbutrin [bupropion]   Social History   Socioeconomic History   Marital status: Married    Spouse name: Not on file   Number of children: 2   Years of education:  Not on file   Highest education level: Not on file  Occupational History   Not on file  Tobacco Use   Smoking status: Former    Packs/day: 2.00    Years: 30.00    Total pack years: 60.00    Types: Cigarettes    Start date: 03/26/1963    Quit date: 03/25/2014    Years since quitting: 8.0   Smokeless tobacco: Never  Substance and Sexual Activity   Alcohol use: Yes    Alcohol/week: 1.0 standard drink of alcohol    Types: 1 Cans of beer per week    Comment: occ   Drug use: Never   Sexual activity: Not on file  Other Topics Concern   Not on file  Social History Narrative   Not on file   Social Determinants of Health   Financial Resource Strain: Low Risk  (08/20/2021)   Overall Financial Resource Strain (CARDIA)    Difficulty of Paying Living Expenses: Not hard at all  Food Insecurity: No Food Insecurity (08/20/2021)   Hunger Vital Sign    Worried About Running Out of Food in the Last Year: Never  true    Ran Out of Food in the Last Year: Never true  Transportation Needs: No Transportation Needs (08/20/2021)   PRAPARE - Hydrologist (Medical): No    Lack of Transportation (Non-Medical): No  Physical Activity: Sufficiently Active (08/20/2021)   Exercise Vital Sign    Days of Exercise per Week: 5 days    Minutes of Exercise per Session: 50 min  Stress: No Stress Concern Present (08/20/2021)   [place]    Feeling of Stress : Not at all  Social Connections: Moderately Integrated (08/20/2021)   Social Connection and Isolation Panel [NHANES]    Frequency of Communication with Friends and Family: More than three times a week    Frequency of Social Gatherings with Friends and Family: More than three times a week    Attends Religious Services: 1 to 4 times per year    Active Member of [employer] or Organizations: No    Attends Archivist Meetings: Never    Marital Status: Married      Family History: The patient's family history includes Heart attack in his half-brother.  ROS:   Please see the history of present illness.     All other systems reviewed and are negative.  EKGs/Labs/Other Studies Reviewed:    The following studies were reviewed today:   EKG: April 08, 2022: Normal sinus rhythm at 66.  Normal EKG.  Recent Labs: 08/12/2021: ALT 36; BUN 21; Creatinine, Ser 1.15; Hemoglobin 13.9; Platelets 174.0; Potassium 4.3; Sodium 139; TSH 2.38  Recent Lipid Panel    Component Value Date/Time   CHOL 118 08/12/2021 1402   TRIG 102.0 08/12/2021 1402   HDL 45.60 08/12/2021 1402   CHOLHDL 3 08/12/2021 1402   VLDL 20.4 08/12/2021 1402   LDLCALC 52 08/12/2021 1402     Risk Assessment/Calculations:                Physical Exam:    VS:  BP 122/72   Pulse 66   Ht '5\' 7"'$  (1.702 m)   Wt 170 lb 9.6 oz (77.4 kg)   SpO2 98%   BMI 26.72 kg/m     Wt Readings from Last 3 Encounters:  04/08/22 170 lb 9.6 oz (77.4 kg)  08/12/21 167 lb 12.8 oz (76.1 kg)  03/03/20 168 lb 3.2 oz (76.3 kg)     GEN:  Well nourished, well developed in no acute distress HEENT: Normal NECK: No JVD; No carotid bruits LYMPHATICS: No lymphadenopathy CARDIAC: RRR, soft systolic and diastolic murmurs. RESPIRATORY:  Clear to auscultation without rales, wheezing or rhonchi  ABDOMEN: Soft, non-tender, non-distended MUSCULOSKELETAL:  No edema; No deformity  SKIN: Warm and dry NEUROLOGIC:  Alert and oriented x 3 PSYCHIATRIC:  Normal affect   ASSESSMENT:    1. Hyperlipidemia, unspecified hyperlipidemia type   2. Aortic valve insufficiency, etiology of cardiac valve disease unspecified    PLAN:    In order of problems listed above:  Aortic insufficiency: Norm has mild to moderate aortic insufficiency by echo approximately 4 5 years ago.  He seems to be stable.  Will repeat his echo to update his records.  2.  Hyperlipidemia: He is on pravastatin.  His last LDL is 52.  Will  repeat his lipid profile as well as a LP(a).  3.  Coronary artery calcifications: He is a long-term smoker and gets lung screening CT scans.  He has been noted to have coronary calcifications.  Will get a coronary calcium score to provide [place] with an actual risk stratification for him.           Medication Adjustments/Labs and Tests Ordered: Current medicines are reviewed at length with the patient today.  Concerns regarding medicines are outlined above.  Orders Placed This Encounter  Procedures   CT CARDIAC SCORING (SELF PAY ONLY)   Lipoprotein A (LPA)   Lipid Profile   ALT   Basic Metabolic Panel (BMET)   EKG 12-Lead   ECHOCARDIOGRAM COMPLETE   No orders of the defined types were placed in this encounter.   Patient Instructions  Medication Instructions:  Your physician recommends that you continue on your current medications as directed. Please refer to the Current Medication list given to you today.  *If you need a refill on your cardiac medications before your next appointment, please call your pharmacy*   Lab Work: Lab work to be done today--Lipoprotein (a), Lipid, ALT, BMP If you have labs (blood work) drawn today and your tests are completely normal, you will receive your results only by: [place] (if you have MyChart) OR A paper copy in the mail If you have any lab test that is abnormal or we need to change your treatment, we will call you to review the results.   Testing/Procedures: Your physician has requested that you have an echocardiogram. Echocardiography is a painless test that uses sound waves to create images of your heart. It provides your doctor with information about the size and shape of your heart and how well your heart's chambers and valves are working. This procedure takes approximately one hour. There are no restrictions for this procedure. Please do NOT wear cologne, perfume, aftershave, or lotions (deodorant is allowed). Please arrive 15  minutes prior to your appointment time.  Dr Acie Fredrickson recommends you have a Calcium Score CT Scan   Follow-Up: At [facility], you and your health needs are our priority.  As part of our continuing mission to provide you with exceptional heart care, we have created designated Provider Care Teams.  These Care Teams include your primary Cardiologist (physician) and Advanced Practice Providers (APPs -  Physician Assistants and Nurse Practitioners) who all work together to provide you with the care you need, when you need it.  We recommend signing up for the patient portal called "MyChart".  Sign up information is provided on this After Visit Summary.  MyChart is used to connect with patients for Virtual Visits (Telemedicine).  Patients are able to view lab/test results, encounter notes, upcoming appointments, etc.  Non-urgent messages can be sent to your provider as well.   To learn more about what you can do with MyChart, go to [email or web address].    Your next appointment:   12 month(s)  The format for your next appointment:   In Person  Provider:   Mertie Moores, MD     Other Instructions    Important Information About Sugar         Signed, Mertie Moores, MD  04/08/2022 6:00 PM    [place]

## 2022-04-08 ENCOUNTER — Ambulatory Visit: Payer: Medicare Other | Attending: Cardiovascular Disease | Admitting: Cardiovascular Disease

## 2022-04-08 ENCOUNTER — Encounter: Payer: Self-pay | Admitting: Cardiovascular Disease

## 2022-04-08 VITALS — BP 122/72 | HR 66 | Ht 67.0 in | Wt 170.6 lb

## 2022-04-08 DIAGNOSIS — I351 Nonrheumatic aortic (valve) insufficiency: Secondary | ICD-10-CM | POA: Diagnosis not present

## 2022-04-08 DIAGNOSIS — E785 Hyperlipidemia, unspecified: Secondary | ICD-10-CM | POA: Diagnosis not present

## 2022-04-08 NOTE — Patient Instructions (Signed)
Medication Instructions:  Your physician recommends that you continue on your current medications as directed. Please refer to the Current Medication list given to you today.  *If you need a refill on your cardiac medications before your next appointment, please call your pharmacy*   Lab Work: Lab work to be done today--Lipoprotein (a), Lipid, ALT, BMP If you have labs (blood work) drawn today and your tests are completely normal, you will receive your results only by: [place] (if you have MyChart) OR A paper copy in the mail If you have any lab test that is abnormal or we need to change your treatment, we will call you to review the results.   Testing/Procedures: Your physician has requested that you have an echocardiogram. Echocardiography is a painless test that uses sound waves to create images of your heart. It provides your doctor with information about the size and shape of your heart and how well your heart's chambers and valves are working. This procedure takes approximately one hour. There are no restrictions for this procedure. Please do NOT wear cologne, perfume, aftershave, or lotions (deodorant is allowed). Please arrive 15 minutes prior to your appointment time.  Dr Acie Fredrickson recommends you have a Calcium Score CT Scan   Follow-Up: At [facility], you and your health needs are our priority.  As part of our continuing mission to provide you with exceptional heart care, we have created designated Provider Care Teams.  These Care Teams include your primary Cardiologist (physician) and Advanced Practice Providers (APPs -  Physician Assistants and Nurse Practitioners) who all work together to provide you with the care you need, when you need it.  We recommend signing up for the patient portal called "MyChart".  Sign up information is provided on this After Visit Summary.  MyChart is used to connect with patients for Virtual Visits (Telemedicine).  Patients are able  to view lab/test results, encounter notes, upcoming appointments, etc.  Non-urgent messages can be sent to your provider as well.   To learn more about what you can do with MyChart, go to [email or web address].    Your next appointment:   12 month(s)  The format for your next appointment:   In Person  Provider:   Mertie Moores, MD     Other Instructions    Important Information About Sugar

## 2022-04-11 LAB — BASIC METABOLIC PANEL
BUN/Creatinine Ratio: 15 (ref 10–24)
BUN: 17 mg/dL (ref 8–27)
CO2: 22 mmol/L (ref 20–29)
Calcium: 9.8 mg/dL (ref 8.6–10.2)
Chloride: 102 mmol/L (ref 96–106)
Creatinine, Ser: 1.12 mg/dL (ref 0.76–1.27)
Glucose: 86 mg/dL (ref 70–99)
Potassium: 4.3 mmol/L (ref 3.5–5.2)
Sodium: 142 mmol/L (ref 134–144)
eGFR: 69 mL/min/{1.73_m2} (ref >59)

## 2022-04-11 LAB — LIPID PANEL
Chol/HDL Ratio: 3.4 ratio (ref 0.0–5.0)
Cholesterol, Total: 137 mg/dL (ref 100–199)
HDL: 40 mg/dL (ref >39)
LDL Chol Calc (NIH): 53 mg/dL (ref 0–99)
Triglycerides: 284 mg/dL — ABNORMAL HIGH (ref 0–149)
VLDL Cholesterol Cal: 44 mg/dL — ABNORMAL HIGH (ref 5–40)

## 2022-04-11 LAB — ALT: ALT: 34 IU/L (ref 0–44)

## 2022-04-11 LAB — LIPOPROTEIN A (LPA): Lipoprotein (a): 17 nmol/L (ref <75.0)

## 2022-04-15 MED ORDER — FENOFIBRATE 145 MG PO TABS: 145.0000 mg | ORAL_TABLET | Freq: Every day | ORAL | 3 refills | Status: DC

## 2022-04-15 NOTE — Telephone Encounter (Signed)
-----   Message from Thayer Headings, MD sent at 04/10/2022  6:39 PM EST ----- Katherene Ponto are elevated.    Add Tricor 145 mg a day  Check lipids , alt in 3 months

## 2022-05-09 ENCOUNTER — Ambulatory Visit (HOSPITAL_COMMUNITY): Payer: Medicare Other | Attending: Cardiovascular Disease

## 2022-05-09 DIAGNOSIS — I351 Nonrheumatic aortic (valve) insufficiency: Secondary | ICD-10-CM | POA: Insufficient documentation

## 2022-05-09 LAB — ECHOCARDIOGRAM COMPLETE
Area-P 1/2: 3.23 cm2
P 1/2 time: 238 msec
S' Lateral: 3.1 cm

## 2022-05-24 ENCOUNTER — Ambulatory Visit (HOSPITAL_BASED_OUTPATIENT_CLINIC_OR_DEPARTMENT_OTHER)
Admission: RE | Admit: 2022-05-24 | Discharge: 2022-05-24 | Disposition: A | Discharge status: Home / Self Care | Payer: Medicare Other | Source: Ambulatory Visit | Attending: Cardiovascular Disease | Admitting: Cardiovascular Disease

## 2022-05-24 DIAGNOSIS — E785 Hyperlipidemia, unspecified: Secondary | ICD-10-CM | POA: Insufficient documentation

## 2022-06-07 ENCOUNTER — Telehealth: Payer: Self-pay | Admitting: Cardiovascular Disease

## 2022-06-07 NOTE — Telephone Encounter (Signed)
Patient stated for the past week he is experiencing, dizziness, increase in urination, lower back pain, and fatigue. He will like to know if there is another type of medication the MD can prescribe. Will forward to MD and nurse.

## 2022-06-07 NOTE — Telephone Encounter (Signed)
Pt c/o medication issue:  1. Name of Medication: fenofibrate (TRICOR) 145 MG tablet   2. How are you currently taking this medication (dosage and times per day)? Take 1 tablet (145 mg total) by mouth daily.   3. Are you having a reaction (difficulty breathing--STAT)?   4. What is your medication issue? States he is urinating a lot at night, and is feeling dizzy, and a general feeling of illness.

## 2022-06-08 NOTE — Telephone Encounter (Signed)
Called and spoke with patient to inform him that Fenofibrate does not typically cause any of the effects he's describing. Additionally, he condones that he has been taking the medication daily since prescribed on 04/15/22 and these issues have only arisen in the last week. He states that today he feels fine, no issues at all and still has not missed any doses. States he will continue to take the medication and if these symptoms recur he will let us know. I explained that sounded like a good plan and likely was a virus or something completely unrelated going on.

## 2022-06-13 ENCOUNTER — Other Ambulatory Visit: Payer: Self-pay

## 2022-06-13 DIAGNOSIS — I10 Essential (primary) hypertension: Secondary | ICD-10-CM

## 2022-06-13 DIAGNOSIS — Z79899 Other long term (current) drug therapy: Secondary | ICD-10-CM

## 2022-06-13 MED ORDER — FENOFIBRATE 145 MG PO TABS: 145.0000 mg | ORAL_TABLET | Freq: Every day | ORAL | 3 refills | Status: AC

## 2022-07-15 ENCOUNTER — Ambulatory Visit: Payer: Medicare Other | Attending: Cardiovascular Disease

## 2022-07-15 ENCOUNTER — Other Ambulatory Visit: Payer: Self-pay | Admitting: *Deleted

## 2022-07-15 ENCOUNTER — Other Ambulatory Visit: Payer: Self-pay | Admitting: Cardiovascular Disease

## 2022-07-15 DIAGNOSIS — E785 Hyperlipidemia, unspecified: Secondary | ICD-10-CM | POA: Diagnosis not present

## 2022-07-15 DIAGNOSIS — Z79899 Other long term (current) drug therapy: Secondary | ICD-10-CM

## 2022-07-15 DIAGNOSIS — I10 Essential (primary) hypertension: Secondary | ICD-10-CM

## 2022-07-15 NOTE — Progress Notes (Signed)
Lipids and ALT order needed per [facility]

## 2022-07-16 LAB — ALT: ALT: 36 IU/L (ref 0–44)

## 2022-07-16 LAB — LIPID PANEL
Chol/HDL Ratio: 2.9 ratio (ref 0.0–5.0)
Cholesterol, Total: 135 mg/dL (ref 100–199)
HDL: 47 mg/dL (ref >39)
LDL Chol Calc (NIH): 68 mg/dL (ref 0–99)
Triglycerides: 107 mg/dL (ref 0–149)
VLDL Cholesterol Cal: 20 mg/dL (ref 5–40)

## 2022-08-15 ENCOUNTER — Encounter: Payer: Self-pay | Admitting: Family Medicine

## 2022-08-15 ENCOUNTER — Ambulatory Visit (INDEPENDENT_AMBULATORY_CARE_PROVIDER_SITE_OTHER): Payer: Medicare Other | Admitting: Family Medicine

## 2022-08-15 VITALS — BP 126/66 | HR 68 | Temp 98.2°F | Ht 67.0 in | Wt 168.8 lb

## 2022-08-15 DIAGNOSIS — R739 Hyperglycemia, unspecified: Secondary | ICD-10-CM

## 2022-08-15 DIAGNOSIS — I10 Essential (primary) hypertension: Secondary | ICD-10-CM | POA: Diagnosis not present

## 2022-08-15 DIAGNOSIS — Z1159 Encounter for screening for other viral diseases: Secondary | ICD-10-CM | POA: Diagnosis not present

## 2022-08-15 DIAGNOSIS — E785 Hyperlipidemia, unspecified: Secondary | ICD-10-CM | POA: Diagnosis not present

## 2022-08-15 NOTE — Assessment & Plan Note (Signed)
At goal on valsartan 80 mg daily.  Metabolic panel from a few months ago was at goal.  We can recheck again later this year or early next year.

## 2022-08-15 NOTE — Assessment & Plan Note (Signed)
Check A1c next lab draw.

## 2022-08-15 NOTE — Assessment & Plan Note (Signed)
Follows with cardiology.  Last lipid panel at goal.  He is on pravastatin 40 mg daily and fenofibrate 145 mg daily.

## 2022-08-15 NOTE — Progress Notes (Signed)
Luisfernando Brightwell SR is a 77 y.o. male who presents today for an office visit.  Assessment/Plan:  Chronic Problems Addressed Today: Dyslipidemia Follows with cardiology.  Last lipid panel at goal.  He is on pravastatin 40 mg daily and fenofibrate 145 mg daily.  Essential hypertension At goal on valsartan 80 mg daily.  Metabolic panel from a few months ago was at goal.  We can recheck again later this year or early next year.  Hyperglycemia Check A1c next lab draw.  Preventative health care Due for colonoscopy next year.  Due for lung cancer screening later this year.  Up-to-date on vaccines.    Subjective:  HPI:  See A/p for status of chronic conditions. He is here today for annual follow up. He has no acute concerns today.   He has seen cardiology since our last visit.  They have started him on fenofibrate for elevated triglycerides.  He is tolerating well.  ROS: Per HPI, otherwise a complete review of systems was negative.   PMH:  The following were reviewed and entered/updated in epic: Past Medical History:  Diagnosis Date   Coronary artery calcification seen on CAT scan 06/29/2018   06/18 CT: Coronary and aortic atheresclerosis   Hyperglycemia 06/29/2018   Hypertension    Mild hyperlipidemia 06/29/2018   Moderate aortic regurgitation 06/29/2018   Polio    Patient Active Problem List   Diagnosis Date Noted   Essential hypertension 03/03/2020   Dyslipidemia 03/03/2020   GERD (gastroesophageal reflux disease) 03/03/2020   Cataract 03/03/2020   Former smoker 03/03/2020   Aortic atherosclerosis 03/03/2020   Skin cancer 03/03/2020   Moderate aortic regurgitation 06/29/2018   Hyperglycemia 06/29/2018   Past Surgical History:  Procedure Laterality Date   FISTULA PLUG     from tail bone    Family History  Problem Relation Age of Onset   Heart attack Half-Brother     Medications- reviewed and updated Current Outpatient Medications  Medication Sig Dispense Refill    aspirin 81 MG EC tablet 1 tablet     calcium carbonate (OSCAL) 1500 (600 Ca) MG TABS tablet Take 1,500 mg by mouth daily.     Cholecalciferol (VITAMIN D3 SUPER STRENGTH) 50 MCG (2000 UT) TABS Take 2,000 Units by mouth daily.     fenofibrate (TRICOR) 145 MG tablet Take 1 tablet (145 mg total) by mouth daily. 90 tablet 3   Magnesium 250 MG TABS 1 tablet with a meal     Multiple Vitamin (MULTIVITAMIN) tablet Take 1 tablet by mouth daily.     pravastatin (PRAVACHOL) 40 MG tablet TAKE 1 TABLET EVERY DAY (NEED APPOINTMENT FOR FURTHER REFILLS) 90 tablet 0   senna (SENOKOT) 8.6 MG tablet Take 1 tablet by mouth daily.     valsartan (DIOVAN) 80 MG tablet TAKE 1 TABLET EVERY DAY 90 tablet 1   No current facility-administered medications for this visit.    Allergies-reviewed and updated Allergies  Allergen Reactions   Wellbutrin [Bupropion] Swelling and Rash    Pt was admitted to ED    Social History   Socioeconomic History   Marital status: Married    Spouse name: Not on file   Number of children: 2   Years of education: Not on file   Highest education level: 12th grade  Occupational History   Not on file  Tobacco Use   Smoking status: Former    Packs/day: 2.00    Years: 30.00    Additional pack years: 0.00  Total pack years: 60.00    Types: Cigarettes    Start date: 03/26/1963    Quit date: 03/25/2014    Years since quitting: 8.3   Smokeless tobacco: Never  Substance and Sexual Activity   Alcohol use: Yes    Alcohol/week: 1.0 standard drink of alcohol    Types: 1 Cans of beer per week    Comment: occ   Drug use: Never   Sexual activity: Not on file  Other Topics Concern   Not on file  Social History Narrative   Not on file   Social Determinants of Health   Financial Resource Strain: Low Risk  (08/11/2022)   Overall Financial Resource Strain (CARDIA)    Difficulty of Paying Living Expenses: Not hard at all  Food Insecurity: No Food Insecurity (08/11/2022)   Hunger Vital  Sign    Worried About Running Out of Food in the Last Year: Never true    Ran Out of Food in the Last Year: Never true  Transportation Needs: No Transportation Needs (08/11/2022)   PRAPARE - Administrator, Civil Service (Medical): No    Lack of Transportation (Non-Medical): No  Physical Activity: Sufficiently Active (08/11/2022)   Exercise Vital Sign    Days of Exercise per Week: 5 days    Minutes of Exercise per Session: 30 min  Stress: No Stress Concern Present (08/11/2022)   [employer] of Occupational Health - Occupational Stress Questionnaire    Feeling of Stress : Not at all  Social Connections: Unknown (08/11/2022)   Social Connection and Isolation Panel [NHANES]    Frequency of Communication with Friends and Family: More than three times a week    Frequency of Social Gatherings with Friends and Family: Patient declined    Attends Religious Services: Patient declined    Database administrator or Organizations: No    Attends Engineer, structural: Never    Marital Status: Married          Objective:  Physical Exam: BP 126/66   Pulse 68   Temp 98.2 F (36.8 C) (Temporal)   Ht  (1.702 m)   Wt 168 lb 12.8 oz (76.6 kg)   SpO2 98%   BMI 26.44 kg/m   Gen: No acute distress, resting comfortably CV: Regular rate and rhythm with no murmurs appreciated Pulm: Normal work of breathing, clear to auscultation bilaterally with no crackles, wheezes, or rhonchi Neuro: Grossly normal, moves all extremities Psych: Normal affect and thought content      Genell Thede M. Jimmey Ralph, MD 08/15/2022 1:33 PM

## 2022-08-15 NOTE — Patient Instructions (Addendum)
It was very nice to see you today!  Keep up the great work!  No changes today.   Return in about 1 year (around 08/15/2023) for Annual Physical.   Take care, Dr Jimmey Ralph  PLEASE NOTE:  If you had any lab tests, please let us know if you have not heard back within a few days. You may see your results on mychart before we have a chance to review them but we will give you a call once they are reviewed by [place].   If we ordered any referrals today, please let us know if you have not heard from their office within the next week.   If you had any urgent prescriptions sent in today, please check with the pharmacy within an hour of our visit to make sure the prescription was transmitted appropriately.   Please try these tips to maintain a healthy lifestyle:  Eat at least 3 REAL meals and 1-2 snacks per day.  Aim for no more than 5 hours between eating.  If you eat breakfast, please do so within one hour of getting up.   Each meal should contain half fruits/vegetables, one quarter protein, and one quarter carbs (no bigger than a computer mouse)  Cut down on sweet beverages. This includes juice, soda, and sweet tea.   Drink at least 1 glass of water with each meal and aim for at least 8 glasses per day  Exercise at least 150 minutes every week.    Preventive Care 47 Years and Older, Male Preventive care refers to lifestyle choices and visits with your health care provider that can promote health and wellness. Preventive care visits are also called wellness exams. What can I expect for my preventive care visit? Counseling During your preventive care visit, your health care provider may ask about your: Medical history, including: Past medical problems. Family medical history. History of falls. Current health, including: Emotional well-being. Home life and relationship well-being. Sexual activity. Memory and ability to understand (cognition). Lifestyle, including: Alcohol, nicotine or  tobacco, and drug use. Access to firearms. Diet, exercise, and sleep habits. Work and work Astronomer. Sunscreen use. Safety issues such as seatbelt and bike helmet use. Physical exam Your health care provider will check your: Height and weight. These may be used to calculate your BMI (body mass index). BMI is a measurement that tells if you are at a healthy weight. Waist circumference. This measures the distance around your waistline. This measurement also tells if you are at a healthy weight and may help predict your risk of certain diseases, such as type 2 diabetes and high blood pressure. Heart rate and blood pressure. Body temperature. Skin for abnormal spots. What immunizations do I need?  Vaccines are usually given at various ages, according to a schedule. Your health care provider will recommend vaccines for you based on your age, medical history, and lifestyle or other factors, such as travel or where you work. What tests do I need? Screening Your health care provider may recommend screening tests for certain conditions. This may include: Lipid and cholesterol levels. Diabetes screening. This is done by checking your blood sugar (glucose) after you have not eaten for a while (fasting). Hepatitis C test. Hepatitis B test. HIV (human immunodeficiency virus) test. STI (sexually transmitted infection) testing, if you are at risk. Lung cancer screening. Colorectal cancer screening. Prostate cancer screening. Abdominal aortic aneurysm (AAA) screening. You may need this if you are a current or former smoker. Talk with your health care  provider about your test results, treatment options, and if necessary, the need for more tests. Follow these instructions at home: Eating and drinking  Eat a diet that includes fresh fruits and vegetables, whole grains, lean protein, and low-fat dairy products. Limit your intake of foods with high amounts of sugar, saturated fats, and salt. Take  vitamin and mineral supplements as recommended by your health care provider. Do not drink alcohol if your health care provider tells you not to drink. If you drink alcohol: Limit how much you have to 0-2 drinks a day. Know how much alcohol is in your drink. In the [place] one drink equals one 12 oz bottle of beer (355 mL), one 5 oz glass of wine (148 mL), or one 1 oz glass of hard liquor (44 mL). Lifestyle Brush your teeth every morning and night with fluoride toothpaste. Floss one time each day. Exercise for at least 30 minutes 5 or more days each week. Do not use any products that contain nicotine or tobacco. These products include cigarettes, chewing tobacco, and vaping devices, such as e-cigarettes. If you need help quitting, ask your health care provider. Do not use drugs. If you are sexually active, practice safe sex. Use a condom or other form of protection to prevent STIs. Take aspirin only as told by your health care provider. Make sure that you understand how much to take and what form to take. Work with your health care provider to find out whether it is safe and beneficial for you to take aspirin daily. Ask your health care provider if you need to take a cholesterol-lowering medicine (statin). Find healthy ways to manage stress, such as: Meditation, yoga, or listening to music. Journaling. Talking to a trusted person. Spending time with friends and family. Safety Always wear your seat belt while driving or riding in a vehicle. Do not drive: If you have been drinking alcohol. Do not ride with someone who has been drinking. When you are tired or distracted. While texting. If you have been using any mind-altering substances or drugs. Wear a helmet and other protective equipment during sports activities. If you have firearms in your house, make sure you follow all gun safety procedures. Minimize exposure to UV radiation to reduce your risk of skin cancer. What's next? Visit your  health care provider once a year for an annual wellness visit. Ask your health care provider how often you should have your eyes and teeth checked. Stay up to date on all vaccines. This information is not intended to replace advice given to you by your health care provider. Make sure you discuss any questions you have with your health care provider. Document Revised: 10/07/2020 Document Reviewed: 10/07/2020 Elsevier Patient Education  2023 [employer].

## 2022-08-17 ENCOUNTER — Telehealth: Payer: Self-pay | Admitting: Family Medicine

## 2022-08-17 NOTE — Telephone Encounter (Signed)
Contacted Antony Salmon SR to schedule their annual wellness visit. Appointment made for 08/25/2022.  [facility] AWV TEAM Direct Dial [phone]

## 2022-08-25 ENCOUNTER — Ambulatory Visit (INDEPENDENT_AMBULATORY_CARE_PROVIDER_SITE_OTHER): Payer: Medicare Other

## 2022-08-25 VITALS — Wt 168.0 lb

## 2022-08-25 DIAGNOSIS — Z0000 Encounter for general adult medical examination without abnormal findings: Secondary | ICD-10-CM | POA: Diagnosis not present

## 2022-08-25 DIAGNOSIS — Z122 Encounter for screening for malignant neoplasm of respiratory organs: Secondary | ICD-10-CM

## 2022-08-25 NOTE — Addendum Note (Signed)
Addended by: Marzella Schlein on: 08/25/2022 11:26 AM   Modules accepted: Orders

## 2022-08-25 NOTE — Progress Notes (Addendum)
I connected with  Gary Martin on 08/25/22 by a audio enabled telemedicine application and verified that I am speaking with the correct person using two identifiers.  Patient Location: Home  Provider Location: Office/Clinic  I discussed the limitations of evaluation and management by telemedicine. The patient expressed understanding and agreed to proceed.   Subjective:   Gary Martin is a 77 y.o. male who presents for Medicare Annual/Subsequent preventive examination.    Patient Medicare AWV questionnaire was completed by the patient on 08/21/22; I have confirmed that all information answered by patient is correct and no changes since this date.     Review of Systems      Cardiac Risk Factors include: advanced age (>26men, >69 women);dyslipidemia;male gender;hypertension     Objective:    Today's Vitals   08/25/22 1034  Weight: 168 lb (76.2 kg)   Body mass index is 26.31 kg/m.     08/25/2022   10:40 AM 08/20/2021    3:05 PM 08/17/2020    3:30 PM  Advanced Directives  Does Patient Have a Medical Advance Directive? No No Yes  Does patient want to make changes to medical advance directive?   Yes (MAU/Ambulatory/Procedural Areas - Information given)  Would patient like information on creating a medical advance directive? No - Patient declined No - Patient declined     Current Medications (verified) Outpatient Encounter Medications as of 08/25/2022  Medication Sig   aspirin 81 MG EC tablet 1 tablet   calcium carbonate (OSCAL) 1500 (600 Ca) MG TABS tablet Take 1,500 mg by mouth daily.   Cholecalciferol (VITAMIN D3 SUPER STRENGTH) 50 MCG (2000 UT) TABS Take 2,000 Units by mouth daily.   fenofibrate (TRICOR) 145 MG tablet Take 1 tablet (145 mg total) by mouth daily.   Magnesium 250 MG TABS 1 tablet with a meal   Multiple Vitamin (MULTIVITAMIN) tablet Take 1 tablet by mouth daily.   pravastatin (PRAVACHOL) 40 MG tablet TAKE 1 TABLET EVERY DAY (NEED APPOINTMENT FOR FURTHER  REFILLS)   senna (SENOKOT) 8.6 MG tablet Take 1 tablet by mouth as needed.   valsartan (DIOVAN) 80 MG tablet TAKE 1 TABLET EVERY DAY   No facility-administered encounter medications on file as of 08/25/2022.    Allergies (verified) Wellbutrin [bupropion]   History: Past Medical History:  Diagnosis Date   Coronary artery calcification seen on CAT scan 06/29/2018   06/18 CT: Coronary and aortic atheresclerosis   Hyperglycemia 06/29/2018   Hypertension    Mild hyperlipidemia 06/29/2018   Moderate aortic regurgitation 06/29/2018   Polio    Past Surgical History:  Procedure Laterality Date   FISTULA PLUG     from tail bone   Family History  Problem Relation Age of Onset   Heart attack Half-Brother    Social History   Socioeconomic History   Marital status: Married    Spouse name: Not on file   Number of children: 2   Years of education: Not on file   Highest education level: 12th grade  Occupational History   Not on file  Tobacco Use   Smoking status: Former    Packs/day: 2.00    Years: 30.00    Additional pack years: 0.00    Total pack years: 60.00    Types: Cigarettes    Start date: 03/26/1963    Quit date: 03/25/2014    Years since quitting: 8.4   Smokeless tobacco: Never  Substance and Sexual Activity   Alcohol use: Yes  Alcohol/week: 1.0 standard drink of alcohol    Types: 1 Cans of beer per week    Comment: occ   Drug use: Never   Sexual activity: Not on file  Other Topics Concern   Not on file  Social History Narrative   Not on file   Social Determinants of Health   Financial Resource Strain: Low Risk  (08/21/2022)   Overall Financial Resource Strain (CARDIA)    Difficulty of Paying Living Expenses: Not hard at all  Food Insecurity: No Food Insecurity (08/21/2022)   Hunger Vital Sign    Worried About Running Out of Food in the Last Year: Never true    Ran Out of Food in the Last Year: Never true  Transportation Needs: No Transportation Needs (08/21/2022)    PRAPARE - Administrator, Civil Service (Medical): No    Lack of Transportation (Non-Medical): No  Physical Activity: Sufficiently Active (08/21/2022)   Exercise Vital Sign    Days of Exercise per Week: 5 days    Minutes of Exercise per Session: 30 min  Stress: No Stress Concern Present (08/21/2022)   [employer] of Occupational Health - Occupational Stress Questionnaire    Feeling of Stress : Not at all  Social Connections: Unknown (08/21/2022)   Social Connection and Isolation Panel [NHANES]    Frequency of Communication with Friends and Family: More than three times a week    Frequency of Social Gatherings with Friends and Family: Patient declined    Attends Religious Services: Never    Diplomatic Services operational officer: Patient declined    Attends Engineer, structural: Patient declined    Marital Status: Married    Tobacco Counseling Counseling given: Not Answered   Clinical Intake:  Pre-visit preparation completed: Yes  Pain : No/denies pain     BMI - recorded: 26.31 Nutritional Status: BMI 25 -29 Overweight Nutritional Risks: None Diabetes: No  How often do you need to have someone help you when you read instructions, pamphlets, or other written materials from your doctor or pharmacy?: 1 - Never  Diabetic?no  Interpreter Needed?: No  Information entered by :: Lanier Ensign, LPN   Activities of Daily Living    08/21/2022   12:42 PM  In your present state of health, do you have any difficulty performing the following activities:  Hearing? 0  Vision? 0  Difficulty concentrating or making decisions? 0  Walking or climbing stairs? 0  Dressing or bathing? 0  Doing errands, shopping? 0  Preparing Food and eating ? N  Using the Toilet? N  In the past six months, have you accidently leaked urine? N  Do you have problems with loss of bowel control? N  Managing your Medications? N  Managing your Finances? N  Housekeeping or  managing your Housekeeping? N    Patient Care Team: Ardith Dark, MD as PCP - General (Family Medicine) Nahser, Deloris Ping, MD as PCP - Cardiology (Cardiology)  Indicate any recent Medical Services you may have received from other than [facility] providers in the past year (date may be approximate).     Assessment:   This is a routine wellness examination for [place].  Hearing/Vision screen Hearing Screening - Comments:: Pt denies any hearing issues  Vision Screening - Comments:: Pt encouraged to follow up with provider   Dietary issues and exercise activities discussed: Current Exercise Habits: Home exercise routine, Type of exercise: treadmill, Time (Minutes): 30, Frequency (Times/Week): 5, Weekly Exercise (Minutes/Week): 150  Goals Addressed             This Visit's Progress    Patient Stated       Live a happy life        Depression Screen    08/25/2022   10:39 AM 08/15/2022    1:05 PM 08/20/2021    3:04 PM 08/12/2021    1:37 PM 08/17/2020    3:27 PM 03/03/2020    1:51 PM  PHQ 2/9 Scores  PHQ - 2 Score 0 0 0 0 0 0    Fall Risk    08/21/2022   12:42 PM 08/15/2022    1:05 PM 08/20/2021    3:07 PM 08/12/2021    1:38 PM 08/17/2020    3:31 PM  Fall Risk   Falls in the past year? 0 0 0 0 0  Number falls in past yr: 0 0 0 0 0  Injury with Fall? 0 0 0 0 0  Risk for fall due to : Impaired vision No Fall Risks Impaired vision No Fall Risks Impaired vision  Follow up Falls prevention discussed  Falls prevention discussed  Falls prevention discussed    FALL RISK PREVENTION PERTAINING TO THE HOME:  Any stairs in or around the home? Yes  If so, are there any without handrails? No  Home free of loose throw rugs in walkways, pet beds, electrical cords, etc? Yes  Adequate lighting in your home to reduce risk of falls? Yes   ASSISTIVE DEVICES UTILIZED TO PREVENT FALLS:  Life alert? No  Use of a cane, walker or w/c? No  Grab bars in the bathroom? Yes  Shower chair or bench in  shower? Yes  Elevated toilet seat or a handicapped toilet? Yes   TIMED UP AND GO:  Was the test performed? No .   Cognitive Function:        08/25/2022   10:41 AM 08/20/2021    3:07 PM 08/17/2020    3:34 PM  6CIT Screen  What Year? 0 points 0 points 0 points  What month? 0 points 0 points 0 points  What time? 0 points 0 points   Count back from 20 0 points 0 points 0 points  Months in reverse 0 points 0 points 0 points  Repeat phrase 0 points 0 points 0 points  Total Score 0 points 0 points     Immunizations Immunization History  Administered Date(s) Administered   Influenza Split 12/21/2018   Influenza, High Dose Seasonal PF 02/12/2019   Influenza-Unspecified 01/01/2020, 01/24/2022   PFIZER(Purple Top)SARS-COV-2 Vaccination 06/23/2019, 07/23/2019, 01/31/2020, 08/10/2020   Pneumococcal Conjugate-13 02/19/2014   Pneumococcal Polysaccharide-23 02/11/2015   Pneumococcal-Unspecified 04/26/2015   Tdap 08/12/2015   Unspecified SARS-COV-2 Vaccination 01/24/2022   Zoster Recombinat (Shingrix) 08/10/2020, 11/02/2020   Zoster, Live 08/12/2015    TDAP status: Up to date  Flu Vaccine status: Up to date  Pneumococcal vaccine status: Up to date  Covid-19 vaccine status: Completed vaccines  Qualifies for Shingles Vaccine? Yes   Zostavax completed Yes   Shingrix Completed?: Yes  Screening Tests Health Maintenance  Topic Date Due   Hepatitis C Screening  Never done   COVID-19 Vaccine (6 - 2023-24 season) 08/31/2022 (Originally 03/21/2022)   INFLUENZA VACCINE  11/24/2022   Lung Cancer Screening  12/10/2022   COLONOSCOPY (Pts 45-8yrs Insurance coverage will need to be confirmed)  04/26/2023   Medicare Annual Wellness (AWV)  08/25/2023   DTaP/Tdap/Td (2 - Td or Tdap) 08/11/2025   Pneumonia  Vaccine 48+ Years old  Completed   Zoster Vaccines- Shingrix  Completed   HPV VACCINES  Aged Out    Health Maintenance  Health Maintenance Due  Topic Date Due   Hepatitis C  Screening  Never done    Colorectal cancer screening: Type of screening: Colonoscopy. Completed 04/25/13. Repeat every 10 years  Lung Cancer Screening: (Low Dose CT Chest recommended if Age 4-80 years, 30 pack-year currently smoking OR have quit w/in 15years.) does qualify.   Lung Cancer Screening Referral: referral placed 08/25/22  Additional Screening:  Hepatitis C Screening: does qualify  Vision Screening: Recommended annual ophthalmology exams for early detection of glaucoma and other disorders of the eye. Is the patient up to date with their annual eye exam?  No  Who is the provider or what is the name of the office in which the patient attends annual eye exams? Encouraged to follow up  If pt is not established with a provider, would they like to be referred to a provider to establish care? No .   Dental Screening: Recommended annual dental exams for proper oral hygiene  Community Resource Referral / Chronic Care Management: CRR required this visit?  No   CCM required this visit?  No      Plan:     I have personally reviewed and noted the following in the patient's chart:   Medical and social history Use of alcohol, tobacco or illicit drugs  Current medications and supplements including opioid prescriptions. Patient is not currently taking opioid prescriptions. Functional ability and status Nutritional status Physical activity Advanced directives List of other physicians Hospitalizations, surgeries, and ER visits in previous 12 months Vitals Screenings to include cognitive, depression, and falls Referrals and appointments  In addition, I have reviewed and discussed with patient certain preventive protocols, quality metrics, and best practice recommendations. A written personalized care plan for preventive services as well as general preventive health recommendations were provided to patient.     Marzella Schlein, LPN   04/30/1094   Nurse Notes: none

## 2022-08-25 NOTE — Patient Instructions (Signed)
[name] , Thank you for taking time to come for your Medicare Wellness Visit. I appreciate your ongoing commitment to your health goals. Please review the following plan we discussed and let me know if I can assist you in the future.   These are the goals we discussed:  Goals      Patient Stated     Keep living      Patient Stated     None at this time      Patient Stated     Live a happy life         This is a list of the screening recommended for you and due dates:  Health Maintenance  Topic Date Due   Hepatitis C Screening: USPSTF Recommendation to screen - Ages 98-79 yo.  Never done   COVID-19 Vaccine (6 - 2023-24 season) 08/31/2022*   Flu Shot  11/24/2022   Screening for Lung Cancer  12/10/2022   Colon Cancer Screening  04/26/2023   Medicare Annual Wellness Visit  08/25/2023   DTaP/Tdap/Td vaccine (2 - Td or Tdap) 08/11/2025   Pneumonia Vaccine  Completed   Zoster (Shingles) Vaccine  Completed   HPV Vaccine  Aged Out  *Topic was postponed. The date shown is not the original due date.    Advanced directives: Advance directive discussed with you today. Even though you declined this today please call our office should you change your mind and we can give you the proper paperwork for you to fill out.  Conditions/risks identified: live a happy life   Next appointment: Follow up in one year for your annual wellness visit.   Preventive Care 67 Years and Older, Male  Preventive care refers to lifestyle choices and visits with your health care provider that can promote health and wellness. What does preventive care include? A yearly physical exam. This is also called an annual well check. Dental exams once or twice a year. Routine eye exams. Ask your health care provider how often you should have your eyes checked. Personal lifestyle choices, including: Daily care of your teeth and gums. Regular physical activity. Eating a healthy diet. Avoiding tobacco and drug  use. Limiting alcohol use. Practicing safe sex. Taking low doses of aspirin every day. Taking vitamin and mineral supplements as recommended by your health care provider. What happens during an annual well check? The services and screenings done by your health care provider during your annual well check will depend on your age, overall health, lifestyle risk factors, and family history of disease. Counseling  Your health care provider may ask you questions about your: Alcohol use. Tobacco use. Drug use. Emotional well-being. Home and relationship well-being. Sexual activity. Eating habits. History of falls. Memory and ability to understand (cognition). Work and work Astronomer. Screening  You may have the following tests or measurements: Height, weight, and BMI. Blood pressure. Lipid and cholesterol levels. These may be checked every 5 years, or more frequently if you are over 61 years old. Skin check. Lung cancer screening. You may have this screening every year starting at age 15 if you have a 30-pack-year history of smoking and currently smoke or have quit within the past 15 years. Fecal occult blood test (FOBT) of the stool. You may have this test every year starting at age 71. Flexible sigmoidoscopy or colonoscopy. You may have a sigmoidoscopy every 5 years or a colonoscopy every 10 years starting at age 42. Prostate cancer screening. Recommendations will vary depending on your  family history and other risks. Hepatitis C blood test. Hepatitis B blood test. Sexually transmitted disease (STD) testing. Diabetes screening. This is done by checking your blood sugar (glucose) after you have not eaten for a while (fasting). You may have this done every 1-3 years. Abdominal aortic aneurysm (AAA) screening. You may need this if you are a current or former smoker. Osteoporosis. You may be screened starting at age 63 if you are at high risk. Talk with your health care provider about  your test results, treatment options, and if necessary, the need for more tests. Vaccines  Your health care provider may recommend certain vaccines, such as: Influenza vaccine. This is recommended every year. Tetanus, diphtheria, and acellular pertussis (Tdap, Td) vaccine. You may need a Td booster every 10 years. Zoster vaccine. You may need this after age 13. Pneumococcal 13-valent conjugate (PCV13) vaccine. One dose is recommended after age 64. Pneumococcal polysaccharide (PPSV23) vaccine. One dose is recommended after age 74. Talk to your health care provider about which screenings and vaccines you need and how often you need them. This information is not intended to replace advice given to you by your health care provider. Make sure you discuss any questions you have with your health care provider. Document Released: 05/08/2015 Document Revised: 12/30/2015 Document Reviewed: 02/10/2015 Elsevier Interactive Patient Education  2017 [place] Prevention in the Home Falls can cause injuries. They can happen to people of all ages. There are many things you can do to make your home safe and to help prevent falls. What can I do on the outside of my home? Regularly fix the edges of walkways and driveways and fix any cracks. Remove anything that might make you trip as you walk through a door, such as a raised step or threshold. Trim any bushes or trees on the path to your home. Use bright outdoor lighting. Clear any walking paths of anything that might make someone trip, such as rocks or tools. Regularly check to see if handrails are loose or broken. Make sure that both sides of any steps have handrails. Any raised decks and porches should have guardrails on the edges. Have any leaves, snow, or ice cleared regularly. Use sand or salt on walking paths during winter. Clean up any spills in your garage right away. This includes oil or grease spills. What can I do in the bathroom? Use  night lights. Install grab bars by the toilet and in the tub and shower. Do not use towel bars as grab bars. Use non-skid mats or decals in the tub or shower. If you need to sit down in the shower, use a plastic, non-slip stool. Keep the floor dry. Clean up any water that spills on the floor as soon as it happens. Remove soap buildup in the tub or shower regularly. Attach bath mats securely with double-sided non-slip rug tape. Do not have throw rugs and other things on the floor that can make you trip. What can I do in the bedroom? Use night lights. Make sure that you have a light by your bed that is easy to reach. Do not use any sheets or blankets that are too big for your bed. They should not hang down onto the floor. Have a firm chair that has side arms. You can use this for support while you get dressed. Do not have throw rugs and other things on the floor that can make you trip. What can I do in the kitchen? Clean up  any spills right away. Avoid walking on wet floors. Keep items that you use a lot in easy-to-reach places. If you need to reach something above you, use a strong step stool that has a grab bar. Keep electrical cords out of the way. Do not use floor polish or wax that makes floors slippery. If you must use wax, use non-skid floor wax. Do not have throw rugs and other things on the floor that can make you trip. What can I do with my stairs? Do not leave any items on the stairs. Make sure that there are handrails on both sides of the stairs and use them. Fix handrails that are broken or loose. Make sure that handrails are as long as the stairways. Check any carpeting to make sure that it is firmly attached to the stairs. Fix any carpet that is loose or worn. Avoid having throw rugs at the top or bottom of the stairs. If you do have throw rugs, attach them to the floor with carpet tape. Make sure that you have a light switch at the top of the stairs and the bottom of the  stairs. If you do not have them, ask someone to add them for you. What else can I do to help prevent falls? Wear shoes that: Do not have high heels. Have rubber bottoms. Are comfortable and fit you well. Are closed at the toe. Do not wear sandals. If you use a stepladder: Make sure that it is fully opened. Do not climb a closed stepladder. Make sure that both sides of the stepladder are locked into place. Ask someone to hold it for you, if possible. Clearly mark and make sure that you can see: Any grab bars or handrails. First and last steps. Where the edge of each step is. Use tools that help you move around (mobility aids) if they are needed. These include: Canes. Walkers. Scooters. Crutches. Turn on the lights when you go into a dark area. Replace any light bulbs as soon as they burn out. Set up your furniture so you have a clear path. Avoid moving your furniture around. If any of your floors are uneven, fix them. If there are any pets around you, be aware of where they are. Review your medicines with your doctor. Some medicines can make you feel dizzy. This can increase your chance of falling. Ask your doctor what other things that you can do to help prevent falls. This information is not intended to replace advice given to you by your health care provider. Make sure you discuss any questions you have with your health care provider. Document Released: 02/05/2009 Document Revised: 09/17/2015 Document Reviewed: 05/16/2014 Elsevier Interactive Patient Education  2017 [employer].

## 2022-12-01 ENCOUNTER — Other Ambulatory Visit: Payer: Self-pay | Admitting: *Deleted

## 2022-12-01 MED ORDER — VALSARTAN 80 MG PO TABS: 80.0000 mg | ORAL_TABLET | Freq: Every day | ORAL | 1 refills | Status: DC

## 2022-12-13 ENCOUNTER — Other Ambulatory Visit: Payer: Medicare Other

## 2023-01-26 ENCOUNTER — Telehealth: Payer: Self-pay | Admitting: Family Medicine

## 2023-01-26 NOTE — Telephone Encounter (Signed)
Patient requests PCP order a CT chest lung cancer screening since this is something he does yearly.

## 2023-01-27 ENCOUNTER — Other Ambulatory Visit: Payer: Self-pay | Admitting: *Deleted

## 2023-01-27 DIAGNOSIS — Z87891 Personal history of nicotine dependence: Secondary | ICD-10-CM

## 2023-01-27 NOTE — Progress Notes (Signed)
ct 

## 2023-01-27 NOTE — Telephone Encounter (Signed)
Ct order

## 2023-02-02 DIAGNOSIS — Z23 Encounter for immunization: Secondary | ICD-10-CM | POA: Diagnosis not present

## 2023-02-10 ENCOUNTER — Other Ambulatory Visit: Payer: Medicare Other

## 2023-02-22 ENCOUNTER — Other Ambulatory Visit: Payer: Medicare Other

## 2023-04-03 ENCOUNTER — Other Ambulatory Visit: Payer: Self-pay | Admitting: Cardiovascular Disease

## 2023-04-03 ENCOUNTER — Other Ambulatory Visit: Payer: Self-pay | Admitting: Family Medicine

## 2023-04-03 DIAGNOSIS — Z79899 Other long term (current) drug therapy: Secondary | ICD-10-CM

## 2023-04-03 DIAGNOSIS — I10 Essential (primary) hypertension: Secondary | ICD-10-CM

## 2023-04-04 ENCOUNTER — Other Ambulatory Visit: Payer: Self-pay

## 2023-04-04 MED ORDER — PRAVASTATIN SODIUM 40 MG PO TABS: 40.0000 mg | ORAL_TABLET | Freq: Every day | ORAL | 0 refills | Status: DC

## 2023-06-08 IMAGING — CT CT CHEST LUNG CANCER SCREENING LOW DOSE W/O CM
1 series · 15 of 32 positions shown, 19 images · non-contrast
Comparison: 10/10/2019.

CLINICAL DATA: Former smoker, quit 6 years ago, 80 pack-year
history.

EXAM:
CT CHEST WITHOUT CONTRAST LOW-DOSE FOR LUNG CANCER SCREENING
TECHNIQUE: Multidetector CT imaging of the chest was performed following the
standard protocol without IV contrast.

[Series 2: ldct screening <30 bmi · axial · 0.75mm/px · z∈[-344,-34]mm · 15 of 71 slices shown, 19 images]
[im 6/71  mediastinal]
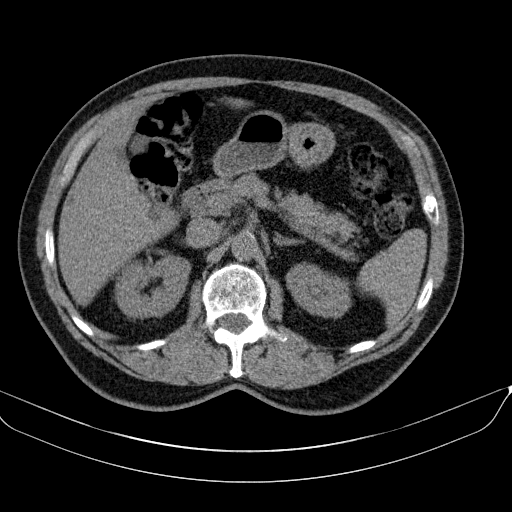
[im 6/71  lung]
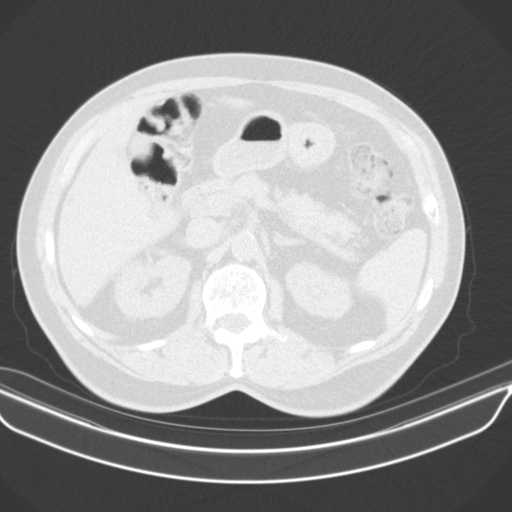
[im 11/71  lung]
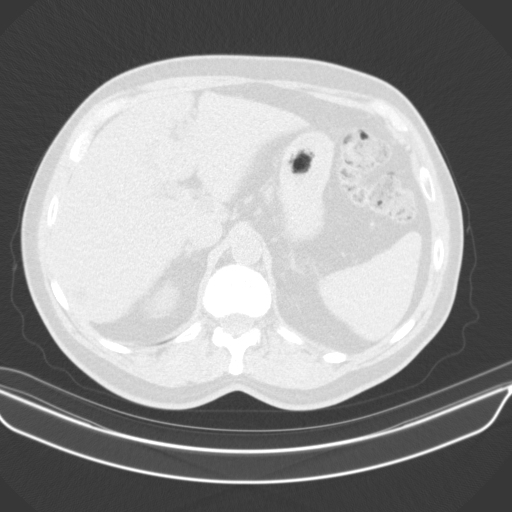
[im 15/71  lung]
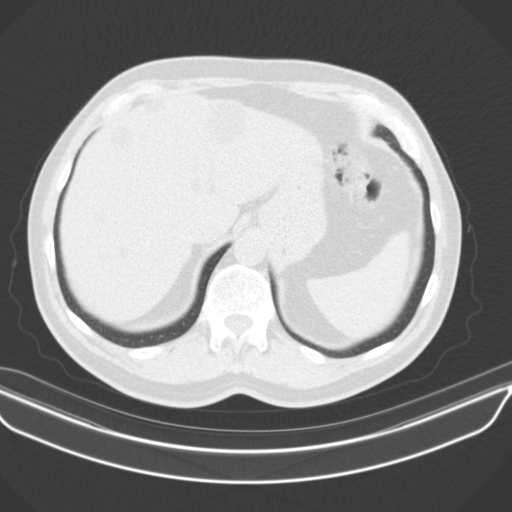
[im 19/71  lung]
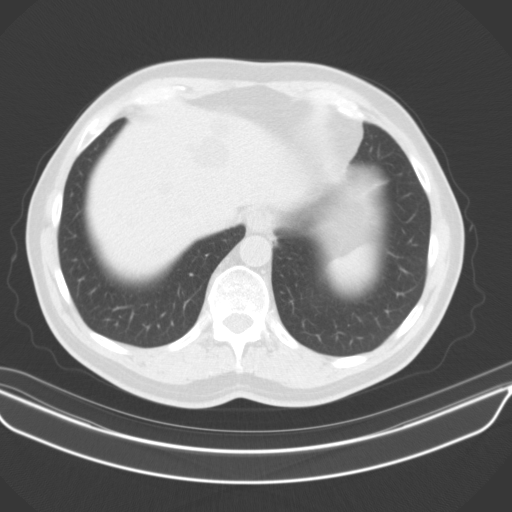
[im 24/71  mediastinal]
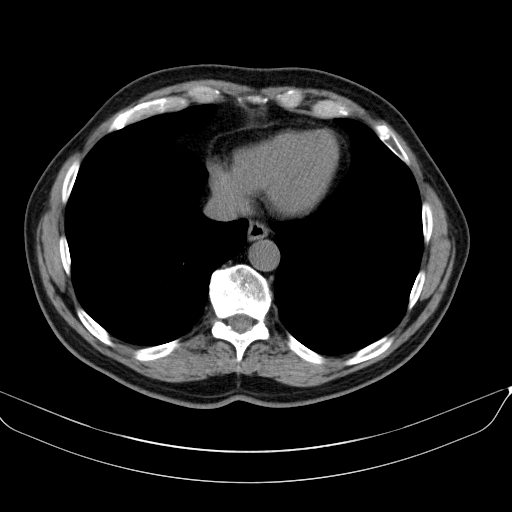
[im 24/71  lung]
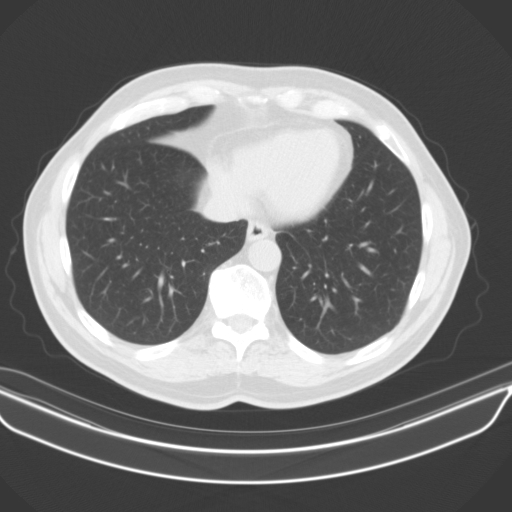
[im 29/71  lung]
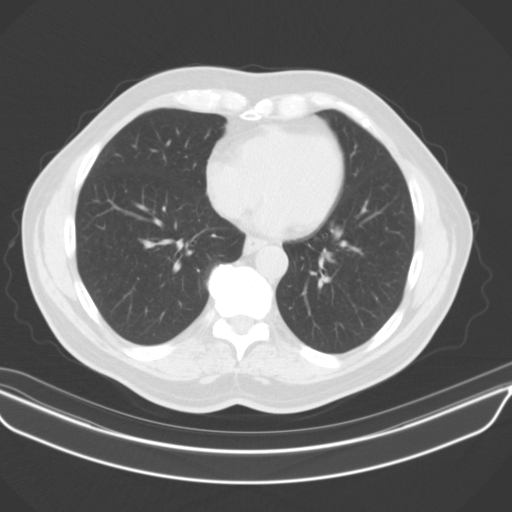
[im 34/71  lung]
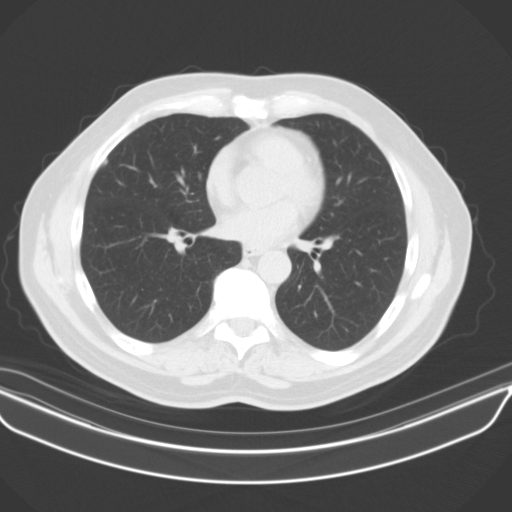
[im 38/71  lung]
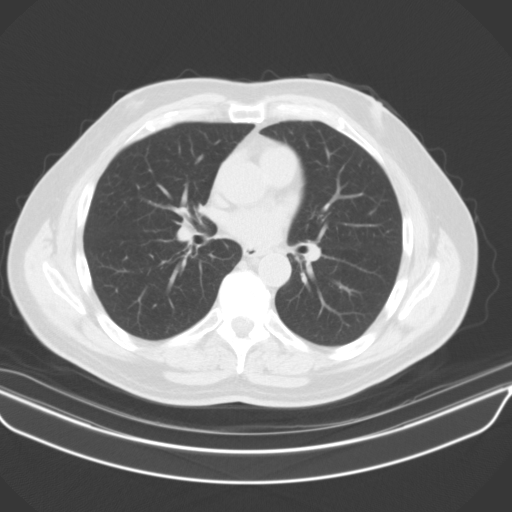
[im 42/71  mediastinal]
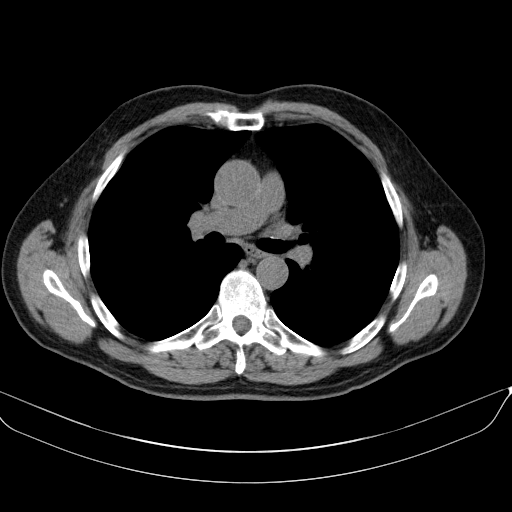
[im 42/71  lung]
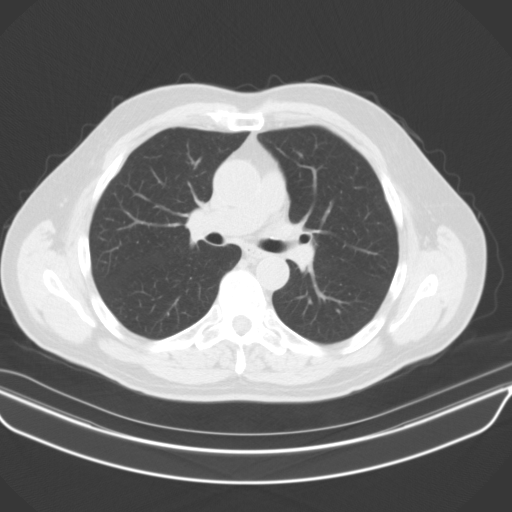
[im 45/71  lung]
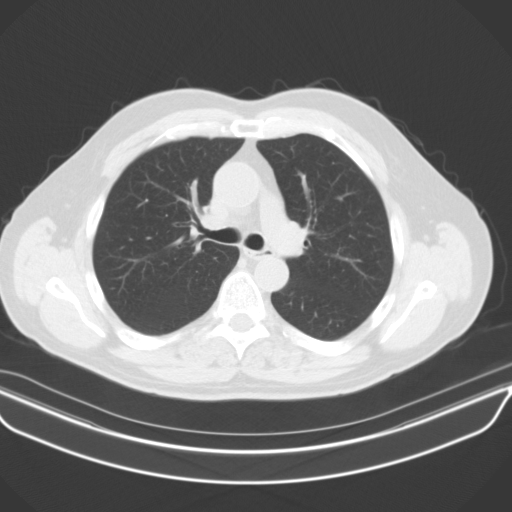
[im 50/71  lung]
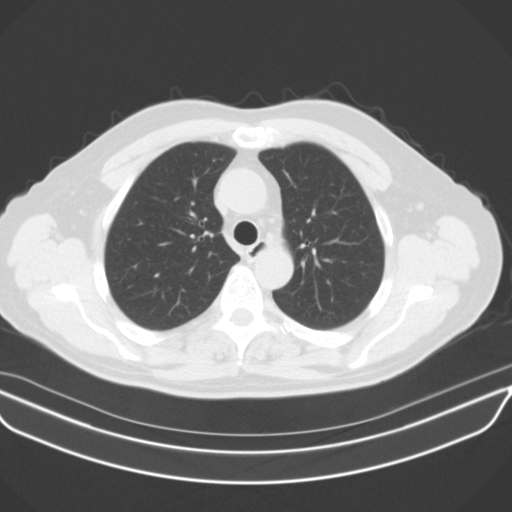
[im 55/71  lung]
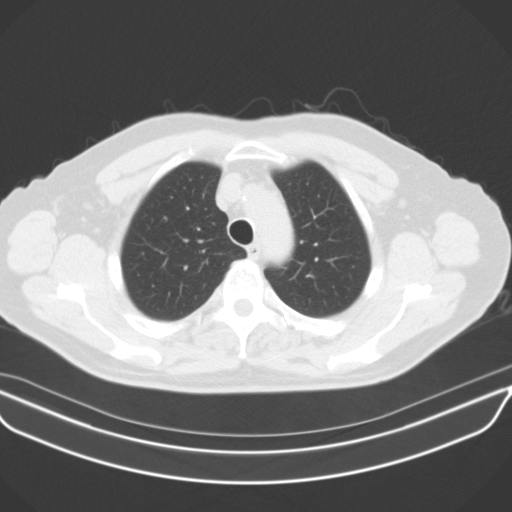
[im 58/71  mediastinal]
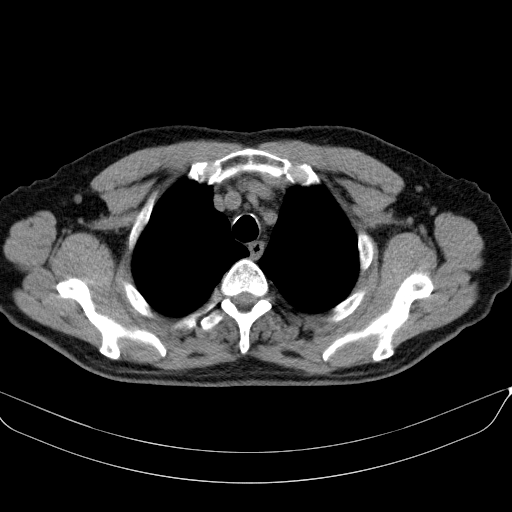
[im 58/71  lung]
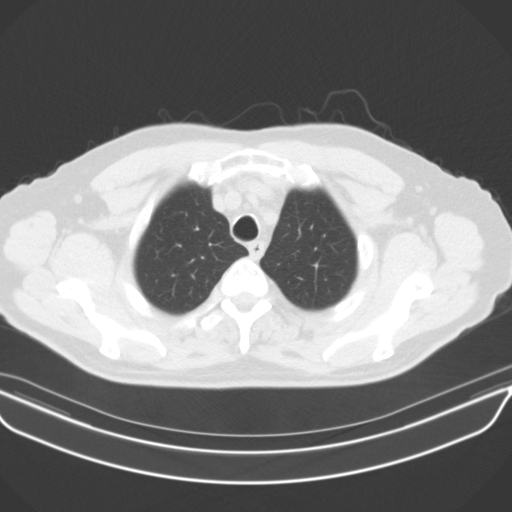
[im 63/71  lung]
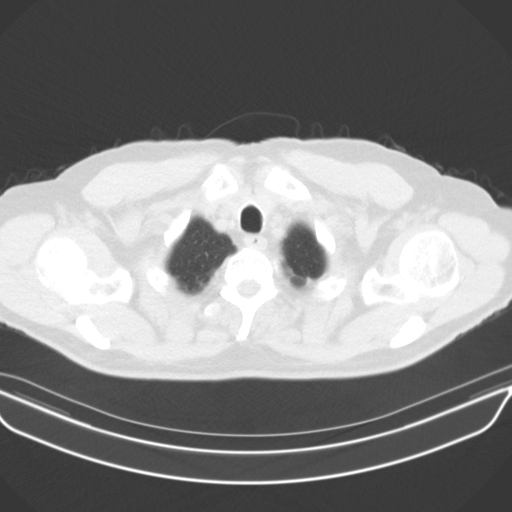
[im 68/71  lung]
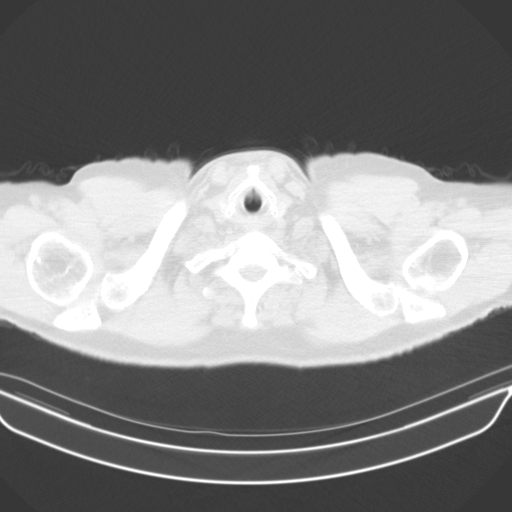

[15 of 32 positions shown; findings below may reference images not displayed]

FINDINGS: Cardiovascular: Atherosclerotic calcification of the aorta, aortic
valve and coronary arteries. Heart size normal. No pericardial
effusion.

Mediastinum/Nodes: No pathologically enlarged mediastinal or
axillary lymph nodes. Hilar regions are difficult to definitively
evaluate without IV contrast. Esophagus is grossly unremarkable.

Lungs/Pleura: Pulmonary nodules measure 4.3 mm or less in size, as
before. No new pulmonary nodules. No pleural fluid. Airway is
unremarkable.

Upper Abdomen: Numerous low-attenuation lesions in the liver measure
up to 3.7 cm in the left hepatic lobe, similar and likely cysts. A
stone is seen in the gallbladder. Visualized portions of the adrenal
glands, kidneys, spleen, pancreas, stomach and bowel are grossly
unremarkable.

Musculoskeletal: Degenerative changes in the spine. No worrisome
lytic or sclerotic lesions.
IMPRESSION: 1. Lung-RADS 2, benign appearance or behavior. Continue annual
screening with low-dose chest CT without contrast in 12 months.
2. Cholelithiasis.
3. Aortic atherosclerosis ([id number]). Coronary artery
calcification.

## 2023-06-16 ENCOUNTER — Other Ambulatory Visit: Payer: Self-pay | Admitting: Family Medicine

## 2023-08-09 ENCOUNTER — Encounter: Payer: Self-pay | Admitting: *Deleted

## 2023-08-17 ENCOUNTER — Encounter: Payer: Medicare Other | Admitting: Family Medicine

## 2023-08-29 ENCOUNTER — Ambulatory Visit: Payer: Medicare Other

## 2024-02-10 ENCOUNTER — Other Ambulatory Visit: Payer: Self-pay | Admitting: Family Medicine
